# Patient Record
Sex: Male | Born: 1955 | Race: White | Hispanic: No | Marital: Married | Smoking: Never smoker
Health system: Southern US, Community
[De-identification: ages and names within clinical notes are randomized; demographics above are authoritative.]

## PROBLEM LIST (undated history)

## (undated) DIAGNOSIS — Z91018 Allergy to other foods: Secondary | ICD-10-CM

## (undated) DIAGNOSIS — N200 Calculus of kidney: Secondary | ICD-10-CM

## (undated) DIAGNOSIS — Z973 Presence of spectacles and contact lenses: Secondary | ICD-10-CM

## (undated) DIAGNOSIS — Z87442 Personal history of urinary calculi: Secondary | ICD-10-CM

## (undated) DIAGNOSIS — E785 Hyperlipidemia, unspecified: Secondary | ICD-10-CM

## (undated) DIAGNOSIS — J45909 Unspecified asthma, uncomplicated: Secondary | ICD-10-CM

## (undated) DIAGNOSIS — I10 Essential (primary) hypertension: Secondary | ICD-10-CM

## (undated) DIAGNOSIS — E669 Obesity, unspecified: Secondary | ICD-10-CM

## (undated) DIAGNOSIS — T7840XA Allergy, unspecified, initial encounter: Secondary | ICD-10-CM

## (undated) DIAGNOSIS — C801 Malignant (primary) neoplasm, unspecified: Secondary | ICD-10-CM

## (undated) HISTORY — DX: Allergy to other foods: Z91.018

## (undated) HISTORY — DX: Hyperlipidemia, unspecified: E78.5

## (undated) HISTORY — PX: PROSTATE BIOPSY: SHX241

## (undated) HISTORY — DX: Presence of spectacles and contact lenses: Z97.3

## (undated) HISTORY — PX: LITHOTRIPSY: SUR834

## (undated) HISTORY — DX: Obesity, unspecified: E66.9

## (undated) HISTORY — DX: Malignant (primary) neoplasm, unspecified: C80.1

## (undated) HISTORY — DX: Allergy, unspecified, initial encounter: T78.40XA

## (undated) HISTORY — DX: Unspecified asthma, uncomplicated: J45.909

---

## 1999-02-26 ENCOUNTER — Emergency Department (HOSPITAL_COMMUNITY): Admission: EM | Admit: 1999-02-26 | Discharge: 1999-02-27 | Payer: Self-pay | Admitting: Emergency Medicine

## 1999-02-26 ENCOUNTER — Encounter: Payer: Self-pay | Admitting: Emergency Medicine

## 1999-03-16 ENCOUNTER — Ambulatory Visit (HOSPITAL_COMMUNITY): Admission: RE | Admit: 1999-03-16 | Discharge: 1999-03-16 | Payer: Self-pay | Admitting: Orthopedic Surgery

## 1999-03-16 ENCOUNTER — Encounter: Payer: Self-pay | Admitting: Orthopedic Surgery

## 1999-04-27 ENCOUNTER — Encounter: Admission: RE | Admit: 1999-04-27 | Discharge: 1999-05-14 | Payer: Self-pay | Admitting: Orthopedic Surgery

## 2001-02-03 ENCOUNTER — Emergency Department (HOSPITAL_COMMUNITY): Admission: EM | Admit: 2001-02-03 | Discharge: 2001-02-03 | Payer: Self-pay | Admitting: Emergency Medicine

## 2004-03-18 ENCOUNTER — Emergency Department (HOSPITAL_COMMUNITY): Admission: EM | Admit: 2004-03-18 | Discharge: 2004-03-18 | Payer: Self-pay | Admitting: Emergency Medicine

## 2004-03-23 ENCOUNTER — Emergency Department (HOSPITAL_COMMUNITY): Admission: EM | Admit: 2004-03-23 | Discharge: 2004-03-23 | Payer: Self-pay | Admitting: Emergency Medicine

## 2006-02-15 ENCOUNTER — Emergency Department (HOSPITAL_COMMUNITY): Admission: EM | Admit: 2006-02-15 | Discharge: 2006-02-15 | Payer: Self-pay | Admitting: Emergency Medicine

## 2008-10-14 DIAGNOSIS — I10 Essential (primary) hypertension: Secondary | ICD-10-CM

## 2008-10-14 HISTORY — DX: Essential (primary) hypertension: I10

## 2009-01-02 ENCOUNTER — Emergency Department (HOSPITAL_COMMUNITY): Admission: EM | Admit: 2009-01-02 | Discharge: 2009-01-02 | Payer: Self-pay | Admitting: Emergency Medicine

## 2009-01-09 ENCOUNTER — Ambulatory Visit (HOSPITAL_COMMUNITY): Admission: RE | Admit: 2009-01-09 | Discharge: 2009-01-09 | Payer: Self-pay | Admitting: Urology

## 2010-05-04 IMAGING — CR DG ABDOMEN 1V
2 series · 2 of 2 positions shown · non-contrast
Comparison: CT abdomen pelvis 01/02/2009

CLINICAL DATA: Pre lithotripsy, right UPJ stone.

ABDOMEN - 1 VIEW

[t abdomen supine * (1 of 2)]
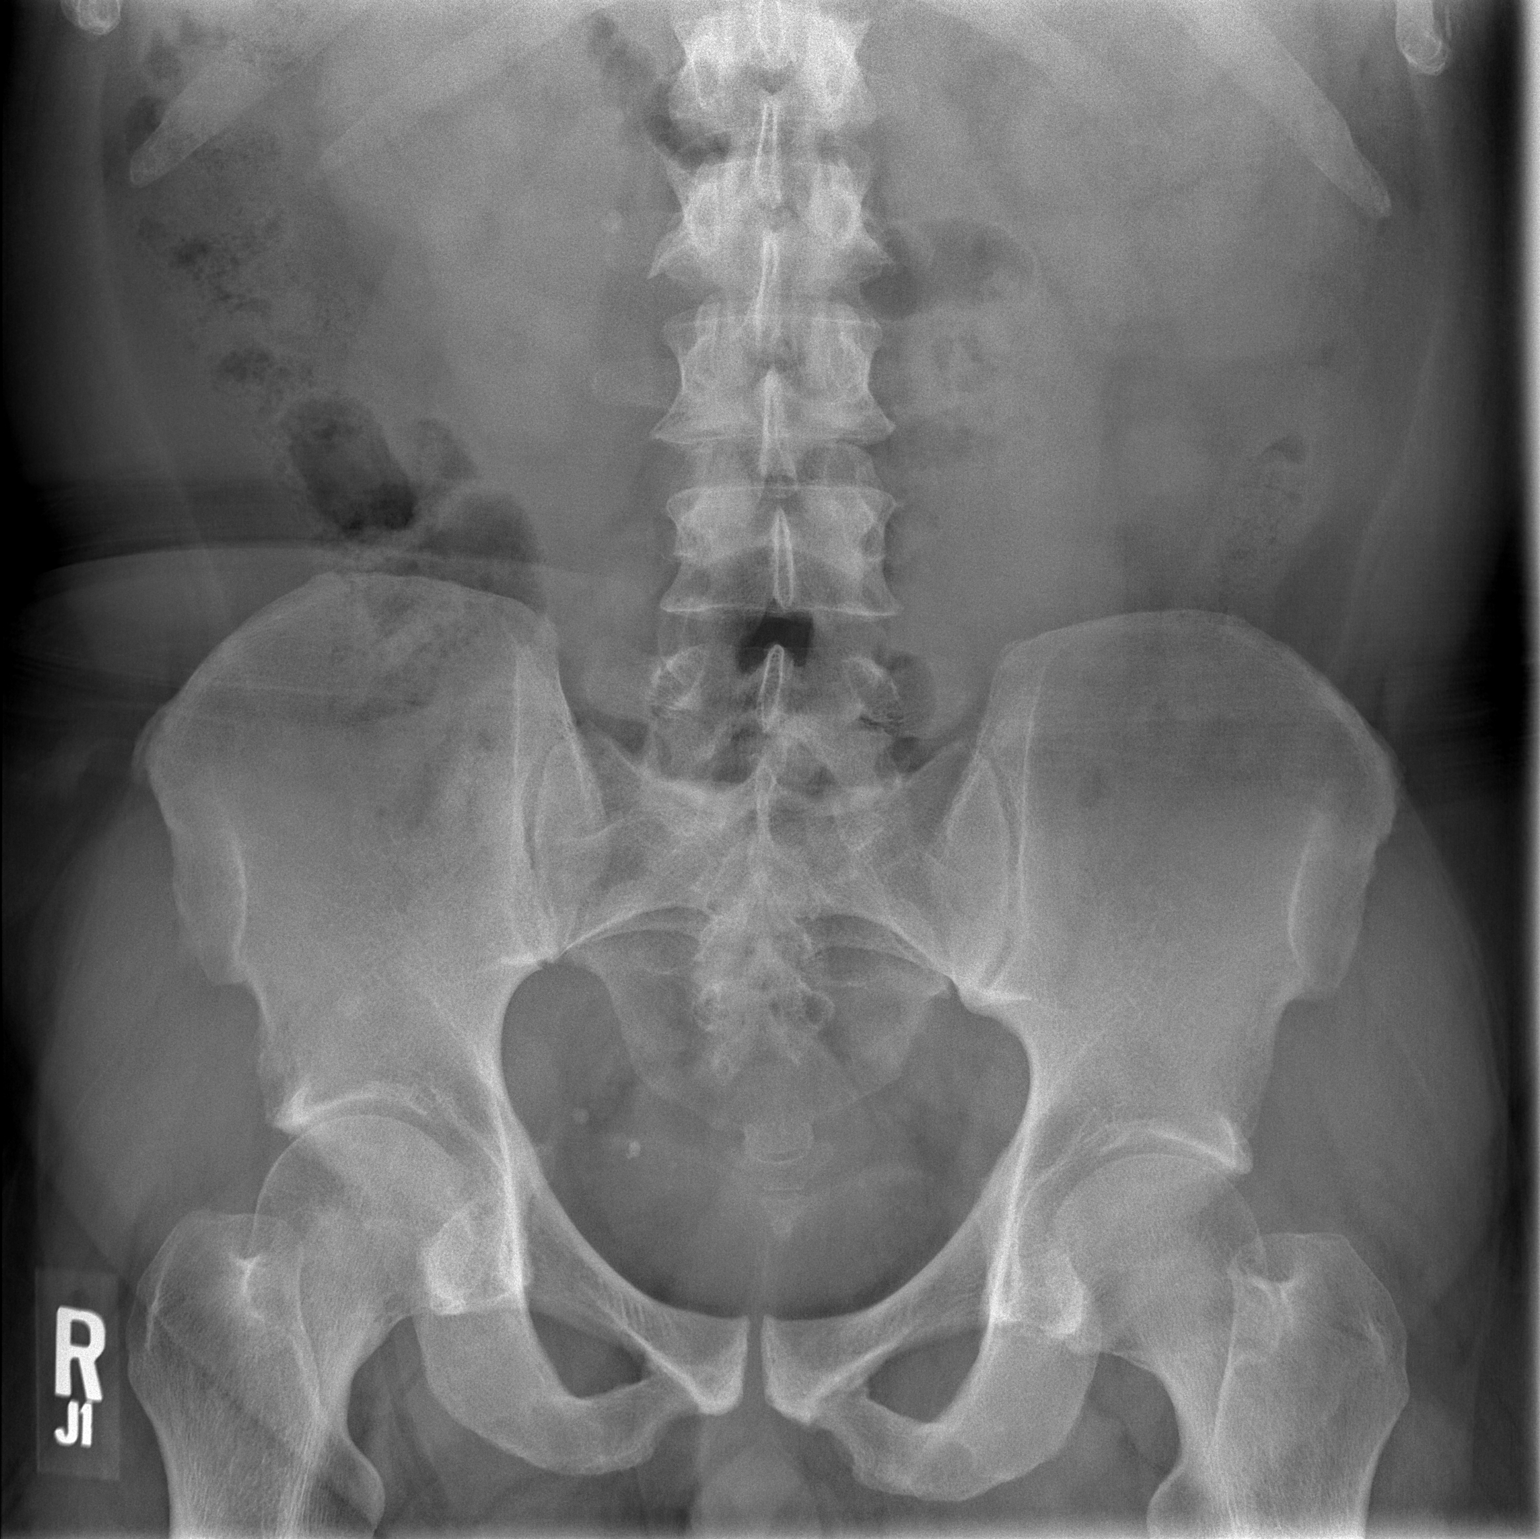

[t abdomen supine * (2 of 2)]
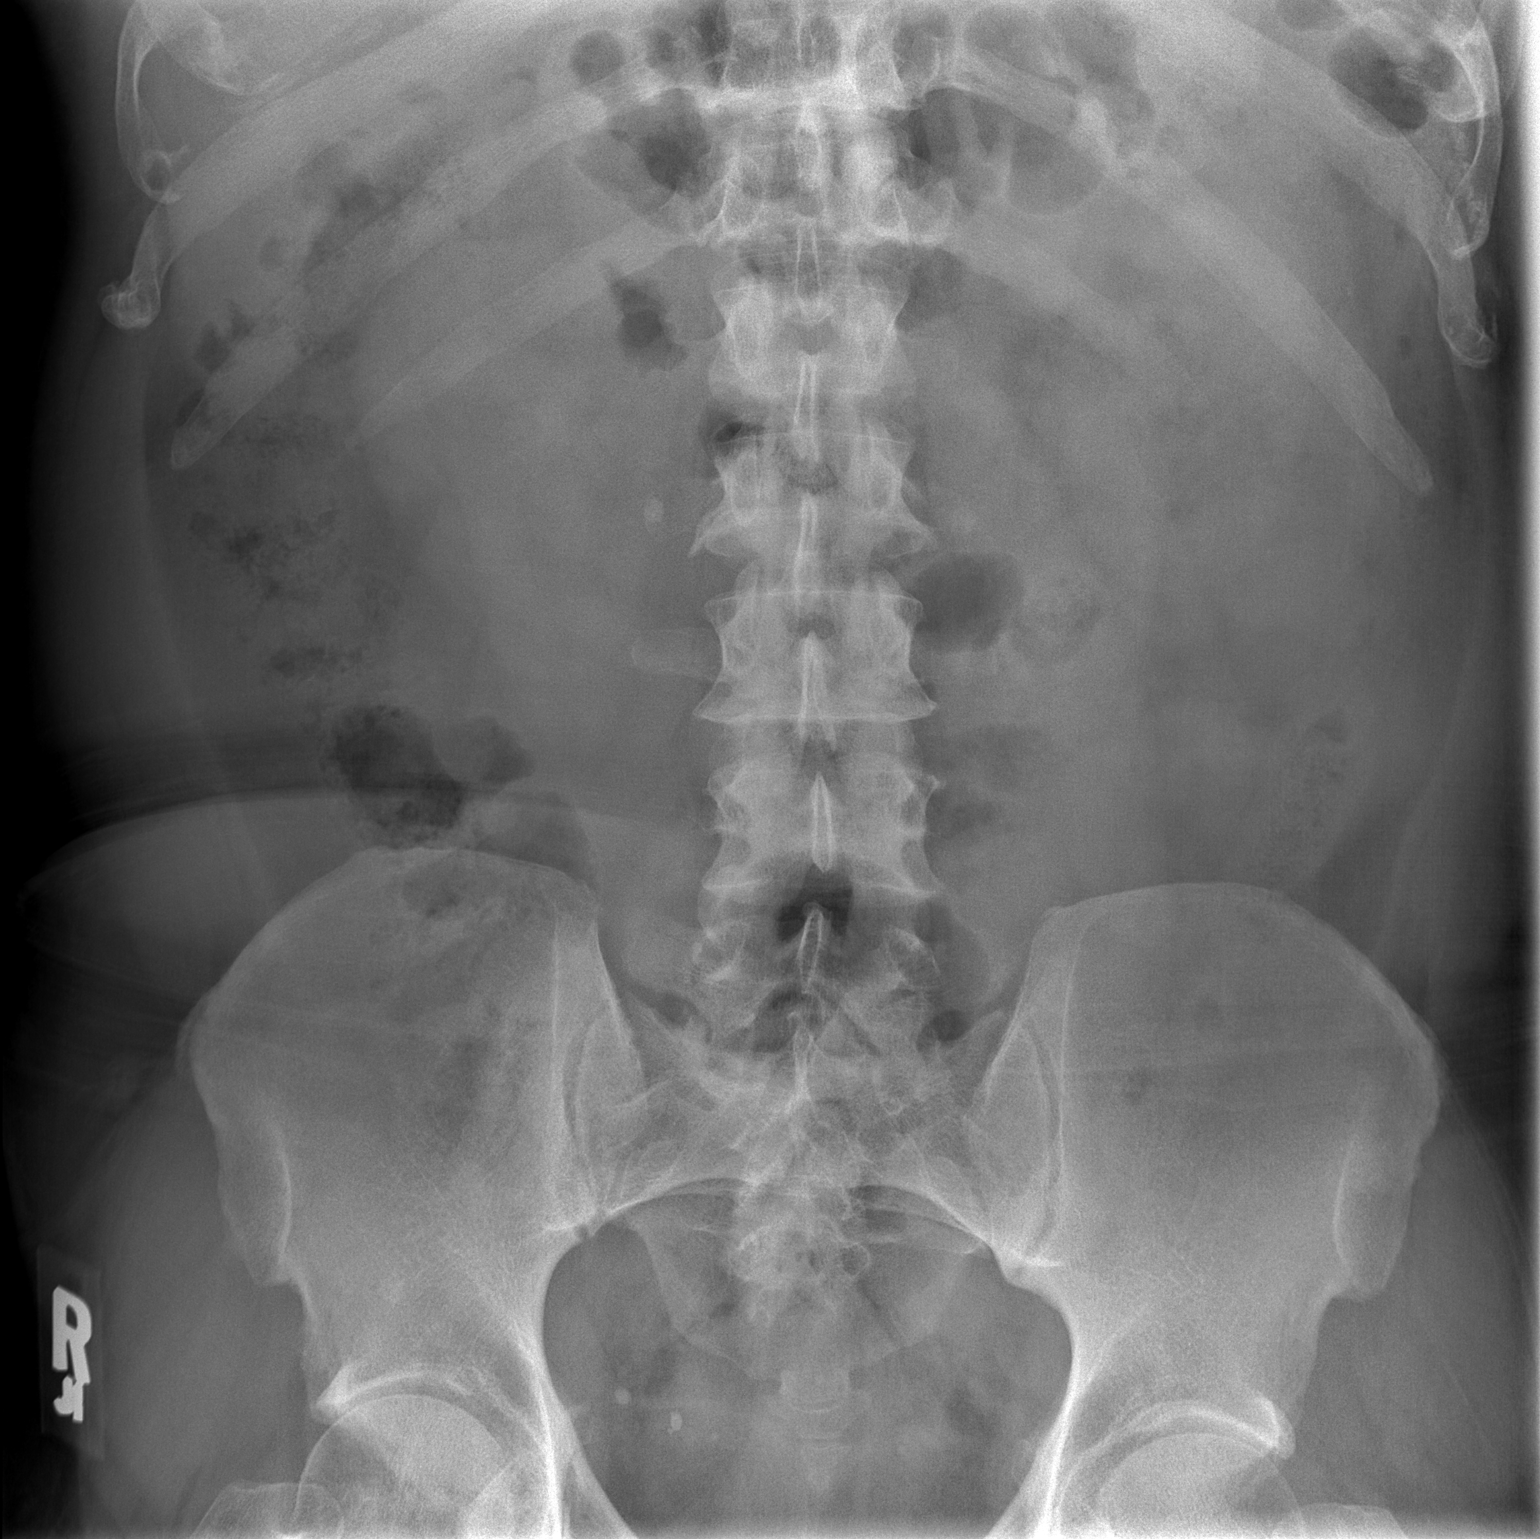

[2 of 2 positions shown; findings below may reference images not displayed]

FINDINGS: A 7 mm stone is seen in the expected location of the
right ureteral pelvic junction.  No additional radiopaque calculi
project over the renal outlines or expected course of the ureters
bilaterally.  Phleboliths are seen in the anatomic pelvis.
IMPRESSION: Right ureteral pelvic junction stone.

## 2011-01-24 LAB — CBC
HCT: 41.9 % (ref 39.0–52.0)
Hemoglobin: 14.1 g/dL (ref 13.0–17.0)
MCV: 94.1 fL (ref 78.0–100.0)
RBC: 4.45 MIL/uL (ref 4.22–5.81)
WBC: 13.3 10*3/uL — ABNORMAL HIGH (ref 4.0–10.5)

## 2011-01-24 LAB — DIFFERENTIAL
Eosinophils Absolute: 0 10*3/uL (ref 0.0–0.7)
Lymphs Abs: 1.1 10*3/uL (ref 0.7–4.0)
Monocytes Absolute: 0.5 10*3/uL (ref 0.1–1.0)
Monocytes Relative: 4 % (ref 3–12)
Neutrophils Relative %: 88 % — ABNORMAL HIGH (ref 43–77)

## 2011-01-24 LAB — BRAIN NATRIURETIC PEPTIDE: Pro B Natriuretic peptide (BNP): <30 pg/mL (ref 0.0–100.0)

## 2011-01-24 LAB — BASIC METABOLIC PANEL
Chloride: 108 mEq/L (ref 96–112)
Creatinine, Ser: 1.35 mg/dL (ref 0.4–1.5)
GFR calc Af Amer: >60 mL/min (ref >60)
Potassium: 4.6 mEq/L (ref 3.5–5.1)
Sodium: 139 mEq/L (ref 135–145)

## 2013-02-12 ENCOUNTER — Encounter (HOSPITAL_COMMUNITY): Payer: Self-pay | Admitting: Emergency Medicine

## 2013-02-12 ENCOUNTER — Emergency Department (HOSPITAL_COMMUNITY)
Admission: EM | Admit: 2013-02-12 | Discharge: 2013-02-12 | Disposition: A | Discharge status: Home / Self Care | Payer: 59 | Source: Home / Self Care

## 2013-02-12 DIAGNOSIS — I10 Essential (primary) hypertension: Secondary | ICD-10-CM

## 2013-02-12 HISTORY — DX: Calculus of kidney: N20.0

## 2013-02-12 HISTORY — DX: Essential (primary) hypertension: I10

## 2013-02-12 MED ORDER — LISINOPRIL-HYDROCHLOROTHIAZIDE 20-25 MG PO TABS: 1.0000 | ORAL_TABLET | Freq: Every day | ORAL | Status: DC

## 2013-02-12 NOTE — ED Notes (Signed)
Ran out of medicine a week ago, blood pressure medicine.  Has noticed "slight" headaches and realized he needed refill

## 2013-02-12 NOTE — ED Provider Notes (Signed)
History     CSN: [id number]  Arrival date & time 02/12/13  1125   None     Chief Complaint  Patient presents with  . Hypertension    (Consider location/radiation/quality/duration/timing/severity/associated sxs/prior treatment) Patient is a 57 y.o. male presenting with hypertension. The history is provided by the patient.  Hypertension This is a chronic problem. Episode onset: ran out of bp med approx 1 week ago, here for refill. The problem has not changed since onset.Pertinent negatives include no chest pain and no shortness of breath.    Past Medical History  Diagnosis Date  . Hypertension   . Kidney stone     Past Surgical History  Procedure Laterality Date  . Lithotripsy      No family history on file.  History  Substance Use Topics  . Smoking status: Never Smoker   . Smokeless tobacco: Not on file  . Alcohol Use: Yes      Review of Systems  Constitutional: Negative.   Respiratory: Negative for shortness of breath.   Cardiovascular: Negative for chest pain.    Allergies  Review of patient's allergies indicates no known allergies.  Home Medications   Current Outpatient Rx  Name  Route  Sig  Dispense  Refill  . lisinopril-hydrochlorothiazide (PRINZIDE,ZESTORETIC) 20-25 MG per tablet   Oral   Take 1 tablet by mouth daily.         Marland Kitchen lisinopril-hydrochlorothiazide (PRINZIDE,ZESTORETIC) 20-25 MG per tablet   Oral   Take 1 tablet by mouth daily.   30 tablet   1     BP 150/100  Pulse 88  Temp(Src) 97.3 F (36.3 C) (Oral)  Resp 18  SpO2 98%  Physical Exam  Nursing note and vitals reviewed. Constitutional: He is oriented to person, place, and time. He appears well-developed and well-nourished. No distress.  Cardiovascular: Regular rhythm and normal heart sounds.   Pulmonary/Chest: Effort normal and breath sounds normal.  Musculoskeletal: He exhibits no edema.  Neurological: He is alert and oriented to person, place, and time.  Skin: Skin is  warm and dry.  Psychiatric: He has a normal mood and affect.    ED Course  Procedures (including critical care time)  Labs Reviewed - No data to display No results found.   1. Hypertension, benign       MDM          Linna Hoff, MD 02/12/13 1239

## 2013-09-27 ENCOUNTER — Ambulatory Visit (INDEPENDENT_AMBULATORY_CARE_PROVIDER_SITE_OTHER): Payer: 59 | Admitting: Medical

## 2013-09-27 ENCOUNTER — Encounter: Payer: Self-pay | Admitting: Medical

## 2013-09-27 VITALS — BP 120/82 | HR 92 | Temp 98.2°F | Resp 18 | Ht 71.0 in | Wt 250.0 lb

## 2013-09-27 DIAGNOSIS — I10 Essential (primary) hypertension: Secondary | ICD-10-CM

## 2013-09-27 DIAGNOSIS — Z91018 Allergy to other foods: Secondary | ICD-10-CM

## 2013-09-27 MED ORDER — EPINEPHRINE 0.3 MG/0.3ML IJ SOAJ: 0.3000 mg | Freq: Once | INTRAMUSCULAR | Status: DC

## 2013-09-27 MED ORDER — LISINOPRIL-HYDROCHLOROTHIAZIDE 20-25 MG PO TABS: 1.0000 | ORAL_TABLET | Freq: Every day | ORAL | Status: DC

## 2013-09-27 NOTE — Progress Notes (Signed)
  Subjective:  Charles Moore is a 57 y.o. male who presents as a new patient today.   Was seeing [facility] prior.  Wants to establish primary care, needs refills on BP medication, ran out today.   Been on HTN medication x 4 years.  Good about taking his medication regular.  Tries to limit salt.  No particular c/o.  BP at home/work normal.  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    Hx/o asthma, no flare in years. Doesn't use inhaler.   No other c/o.   The following portions of the patient's history were reviewed and updated as appropriate: allergies, current medications, past family history, past medical history, past social history, past surgical history and problem list.  Past Medical History  Diagnosis Date  . Kidney stone   . Nut allergy   . Hypertension 2010  . Asthma     rare wheezing  . Wears glasses    Family History  Problem Relation Age of Onset  . Other Mother     pneumonia  . Cancer Father     lung  . Cancer Brother     lung  . Alcohol abuse Brother   . Heart disease Brother 20    CABG  . Hypertension Brother      ROS Otherwise as in subjective above  Objective: Physical Exam  BP 120/82  Pulse 92  Temp(Src) 98.2 F (36.8 C) (Oral)  Resp 18  Ht 5\' 11"  (1.803 m)  Wt 250 lb (113.399 kg)  BMI 34.88 kg/m2   General appearance: alert, no distress, WD/WN Neck: supple, no lymphadenopathy, no thyromegaly, no masses Heart: RRR, normal S1, S2, no murmurs Lungs: CTA bilaterally, no wheezes, rhonchi, or rales Pulses: 2+ radial pulses, 2+ pedal pulses, normal cap refill Ext: no edema   Assessment: Encounter Diagnoses  Name Primary?  . Essential hypertension, benign Yes  . Nut allergy      Plan: HTN - controlled, refilled medication, limit salt in diet, exercise regularly, eat healthy diet.   Nut allergy - discussed potential for exposure,  anaphylaxis.  Gave script for Epipen, discussed proper use, when to use, when to call 911. Follow up: within 3 mo for physical

## 2014-01-10 ENCOUNTER — Other Ambulatory Visit: Payer: Self-pay | Admitting: Family Medicine

## 2014-01-10 ENCOUNTER — Telehealth: Payer: Self-pay | Admitting: Medical

## 2014-01-10 MED ORDER — LISINOPRIL-HYDROCHLOROTHIAZIDE 20-25 MG PO TABS: 1.0000 | ORAL_TABLET | Freq: Every day | ORAL | Status: DC

## 2014-01-10 NOTE — Telephone Encounter (Signed)
Pt made an appt but needs refills on bo med. Please send to [place] outpt pharmacy.

## 2014-01-10 NOTE — Telephone Encounter (Signed)
Rx refills sent to the pharmacy. CLS

## 2014-01-21 ENCOUNTER — Encounter: Payer: 59 | Admitting: Medical

## 2014-01-31 ENCOUNTER — Encounter: Payer: Self-pay | Admitting: Medical

## 2014-01-31 ENCOUNTER — Telehealth: Payer: Self-pay | Admitting: Medical

## 2014-01-31 ENCOUNTER — Ambulatory Visit (INDEPENDENT_AMBULATORY_CARE_PROVIDER_SITE_OTHER): Payer: 59 | Admitting: Medical

## 2014-01-31 VITALS — BP 110/70 | HR 80 | Temp 98.2°F | Resp 16 | Wt 252.0 lb

## 2014-01-31 DIAGNOSIS — Z1211 Encounter for screening for malignant neoplasm of colon: Secondary | ICD-10-CM

## 2014-01-31 DIAGNOSIS — I10 Essential (primary) hypertension: Secondary | ICD-10-CM

## 2014-01-31 DIAGNOSIS — R202 Paresthesia of skin: Secondary | ICD-10-CM

## 2014-01-31 DIAGNOSIS — R209 Unspecified disturbances of skin sensation: Secondary | ICD-10-CM

## 2014-01-31 DIAGNOSIS — R5381 Other malaise: Secondary | ICD-10-CM

## 2014-01-31 DIAGNOSIS — R5383 Other fatigue: Secondary | ICD-10-CM

## 2014-01-31 LAB — CBC
HCT: 36.8 % — ABNORMAL LOW (ref 39.0–52.0)
HEMOGLOBIN: 12.1 g/dL — AB (ref 13.0–17.0)
MCH: 31.3 pg (ref 26.0–34.0)
MCHC: 32.9 g/dL (ref 30.0–36.0)
MCV: 95.3 fL (ref 78.0–100.0)
PLATELETS: 283 10*3/uL (ref 150–400)
RBC: 3.86 MIL/uL — AB (ref 4.22–5.81)
RDW: 13 % (ref 11.5–15.5)
WBC: 6.9 10*3/uL (ref 4.0–10.5)

## 2014-01-31 MED ORDER — LISINOPRIL-HYDROCHLOROTHIAZIDE 20-25 MG PO TABS: 1.0000 | ORAL_TABLET | Freq: Every day | ORAL | Status: DC

## 2014-01-31 NOTE — Telephone Encounter (Signed)
Referral is done and documented in EPIC and the patient is aware of the appointment. CLS

## 2014-01-31 NOTE — Telephone Encounter (Signed)
Refer for first colonoscopy

## 2014-01-31 NOTE — Progress Notes (Signed)
  Subjective:  Charles Moore is a 58 y.o. male who presents for recheck along with his wife.  HTN - compliant with medication without c/o.   Been on HTN medication x 4 years.  Good about taking his medication regular.  Tries to limit salt.  No particular c/o.  BP at home/work normal.  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  BP looking good of late.   Has new problem.  Been having numb tingling/reduced sensation in left hand for months.  Includes mostly fingers 2-5.  He uses hands a lot at work.   No injury.  He does sit with arms propped on table.  No pain in hands, no arm or neck pain.  No prior eval for same.  No treatment to date.   No family hx/o carpal tunnel syndrome.   Here for fasting labs.   Had been having some fatigued feeling, started taking iron OTC and this has helped.  No blood loss, no hx/o anemia.    No other c/o.   The following portions of the patient's history were reviewed and updated as appropriate: allergies, current medications, past family history, past medical history, past social history, past surgical history and problem list.  Past Medical History  Diagnosis Date  . Kidney stone   . Nut allergy   . Hypertension 2010  . Asthma     rare wheezing  . Wears glasses    Family History  Problem Relation Age of Onset  . Other Mother     pneumonia  . Cancer Father     lung  . Cancer Brother     lung  . Alcohol abuse Brother   . Heart disease Brother 68    CABG  . Hypertension Brother      ROS Otherwise as in subjective above  Objective: Physical Exam  BP 110/70  Pulse 80  Temp(Src) 98.2 F (36.8 C) (Oral)  Resp 16  Wt 252 lb (114.306 kg)   General appearance: alert, no distress, WD/WN, AA male Neck: supple, no lymphadenopathy, no thyromegaly, no masses Heart: RRR, normal S1, S2, no murmurs Lungs: CTA bilaterally, no wheezes, rhonchi, or rales Abdomen: +bs,  soft, nontneder, no mass, no organomegaly Pulses: 2+ radial pulses, 2+ pedal pulses, normal cap refill Ext: no edema Neuro: normal UE strength, sensation, DTRs, negative phalens and tinels.   Assessment: Encounter Diagnoses  Name Primary?  . Essential hypertension, benign Yes  . Paresthesia   . Special screening for malignant neoplasms, colon   . Other malaise and fatigue      Plan: HTN - controlled, refilled medication, limit salt in diet, exercise regularly, eat healthy diet.  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  Begin QHS wrist splints, Aleve OTC, recheck 3-4 wk.  Referral for first colonoscopy Fatigue - pending labs.

## 2014-02-01 LAB — LIPID PANEL
CHOL/HDL RATIO: 3.4 ratio
CHOLESTEROL: 131 mg/dL (ref 0–200)
HDL: 39 mg/dL — ABNORMAL LOW (ref >39)
LDL Cholesterol: 73 mg/dL (ref 0–99)
Triglycerides: 94 mg/dL (ref <150)
VLDL: 19 mg/dL (ref 0–40)

## 2014-02-01 LAB — COMPREHENSIVE METABOLIC PANEL
ALT: 17 U/L (ref 0–53)
AST: 17 U/L (ref 0–37)
Albumin: 4.2 g/dL (ref 3.5–5.2)
Alkaline Phosphatase: 52 U/L (ref 39–117)
BILIRUBIN TOTAL: 0.7 mg/dL (ref 0.2–1.2)
BUN: 11 mg/dL (ref 6–23)
CHLORIDE: 103 meq/L (ref 96–112)
CO2: 25 meq/L (ref 19–32)
Calcium: 9.2 mg/dL (ref 8.4–10.5)
Creat: 0.87 mg/dL (ref 0.50–1.35)
Glucose, Bld: 89 mg/dL (ref 70–99)
Potassium: 3.6 mEq/L (ref 3.5–5.3)
SODIUM: 140 meq/L (ref 135–145)
TOTAL PROTEIN: 7.1 g/dL (ref 6.0–8.3)

## 2014-02-01 LAB — TSH: TSH: 2.131 u[IU]/mL (ref 0.350–4.500)

## 2014-03-22 ENCOUNTER — Encounter: Payer: 59 | Admitting: Internal Medicine

## 2014-07-14 ENCOUNTER — Encounter: Payer: 59 | Admitting: Medical

## 2015-05-09 ENCOUNTER — Telehealth: Payer: Self-pay | Admitting: Medical

## 2015-05-09 ENCOUNTER — Other Ambulatory Visit: Payer: Self-pay | Admitting: Family Medicine

## 2015-05-09 MED ORDER — LISINOPRIL-HYDROCHLOROTHIAZIDE 20-25 MG PO TABS: 1.0000 | ORAL_TABLET | Freq: Every day | ORAL | Status: DC

## 2015-05-09 NOTE — Telephone Encounter (Signed)
Pt made med ck appt for 05/15/15. Requesting refill on Lisinopril 20-25mg  because he only has one pill left.

## 2015-05-09 NOTE — Telephone Encounter (Signed)
RX REFILL WAS SENT

## 2015-05-15 ENCOUNTER — Encounter: Payer: Self-pay | Admitting: Medical

## 2015-05-15 ENCOUNTER — Ambulatory Visit (INDEPENDENT_AMBULATORY_CARE_PROVIDER_SITE_OTHER): Payer: 59 | Admitting: Medical

## 2015-05-15 ENCOUNTER — Telehealth: Payer: Self-pay | Admitting: Medical

## 2015-05-15 VITALS — BP 132/82 | HR 64 | Wt 269.4 lb

## 2015-05-15 DIAGNOSIS — Z1211 Encounter for screening for malignant neoplasm of colon: Secondary | ICD-10-CM

## 2015-05-15 DIAGNOSIS — I10 Essential (primary) hypertension: Secondary | ICD-10-CM | POA: Diagnosis not present

## 2015-05-15 DIAGNOSIS — Z9119 Patient's noncompliance with other medical treatment and regimen: Secondary | ICD-10-CM | POA: Diagnosis not present

## 2015-05-15 DIAGNOSIS — Z91199 Patient's noncompliance with other medical treatment and regimen due to unspecified reason: Secondary | ICD-10-CM | POA: Insufficient documentation

## 2015-05-15 MED ORDER — LISINOPRIL-HYDROCHLOROTHIAZIDE 20-25 MG PO TABS: 1.0000 | ORAL_TABLET | Freq: Every day | ORAL | Status: DC

## 2015-05-15 MED ORDER — EPINEPHRINE 0.3 MG/0.3ML IJ SOAJ: 0.3000 mg | Freq: Once | INTRAMUSCULAR | Status: DC

## 2015-05-15 NOTE — Telephone Encounter (Signed)
Refer for screening colonoscopy 

## 2015-05-15 NOTE — Progress Notes (Signed)
    Subjective: Here for follow-up of hypertension.  Last visit almost 16 months ago.   He is still taking his medication and after running out, he was using his wife's medication that is the same brand.  Heis exercising with walking 3 days per week.  He is adherent to a low-salt diet.  Blood pressure is well controlled at home.   Cardiac symptoms: none. Patient denies: chest pain, dyspnea, exertional chest pressure/discomfort, fatigue, irregular heart beat and palpitations. Cardiovascular risk factors: advanced age (older than 71 for men, 74 for women), hypertension, male gender and obesity (BMI >= 30 kg/m2). Use of agents associated with hypertension: none. History of target organ damage: none.  No prior EKG or heart tests.   After last visit, he never was able to fit a colonoscopy in his schedule, so he is yet to have this.    No new c/o, in normal state of health.    Past Medical History  Diagnosis Date  . Kidney stone   . Nut allergy   . Hypertension 2010  . Asthma     rare wheezing  . Wears glasses    ROS as in subjective   Objective: BP 132/82 mmHg  Pulse 64  Wt 269 lb 6.4 oz (122.199 kg)  General appearance: alert, no distress, WD/WN, joyful appearing AA male Neck: supple, no lymphadenopathy, no thyromegaly, no masses Heart: RRR, normal S1, S2, no murmurs Lungs: CTA bilaterally, no wheezes, rhonchi, or rales Abdomen: +bs, soft, non tender, non distended, no masses, no hepatomegaly, no splenomegaly Pulses: 2+ symmetric, upper and lower extremities, normal cap refill No edema   Adult ECG Report  Indication: HTN  Rate: 92 bpm  Rhythm: normal sinus rhythm  QRS Axis: -41 degrees  PR Interval: 159ms  QRS Duration: 89ms  QTc: 436ms  Conduction Disturbances: left axis deviation  Other Abnormalities: borderline LVH, left atrial enlargement  Patient's cardiac risk factors are: advanced age (older than 56 for men, 30 for women), hypertension, male gender and obesity (BMI >=  30 kg/m2).  EKG comparison: none  Narrative Interpretation: left axis deviation, left atrial enlargement, otherwise unremarkable EKG   Assessment:  Encounter Diagnoses  Name Primary?  . Essential hypertension Yes  . Noncompliance   . Special screening for malignant neoplasms, colon     Plan: C/t same medication, c/t healthy diet, exercise, work on weight loss, limit salt, and return soon for fasting labs and physical.   Discussed the importance of compliance, not running out of medications  Referral again for first screening colonoscopy

## 2015-05-15 NOTE — Addendum Note (Signed)
Addended by: Carlena Hurl on: 05/15/2015 07:47 PM   Modules accepted: Orders

## 2015-05-16 NOTE — Telephone Encounter (Signed)
Orders are in EPIC to [facility] and they will contact the patient for his appointment

## 2015-08-07 ENCOUNTER — Telehealth: Payer: Self-pay | Admitting: Medical

## 2015-08-07 MED ORDER — LISINOPRIL-HYDROCHLOROTHIAZIDE 20-25 MG PO TABS: 1.0000 | ORAL_TABLET | Freq: Every day | ORAL | Status: DC

## 2015-08-07 NOTE — Telephone Encounter (Signed)
Sent in 30

## 2015-08-07 NOTE — Telephone Encounter (Signed)
Pt made CPE appt for 08/29/15. Requesting refill on Lisinopril 20-25mg  to last until 11/15 appt since he will run out of this med on Friday

## 2015-08-29 ENCOUNTER — Encounter: Payer: Self-pay | Admitting: Gastroenterology

## 2015-08-29 ENCOUNTER — Encounter: Payer: Self-pay | Admitting: Medical

## 2015-08-29 ENCOUNTER — Ambulatory Visit (INDEPENDENT_AMBULATORY_CARE_PROVIDER_SITE_OTHER): Payer: 59 | Admitting: Medical

## 2015-08-29 VITALS — BP 112/70 | HR 85 | Ht 72.0 in | Wt 254.0 lb

## 2015-08-29 DIAGNOSIS — E669 Obesity, unspecified: Secondary | ICD-10-CM | POA: Diagnosis not present

## 2015-08-29 DIAGNOSIS — Z125 Encounter for screening for malignant neoplasm of prostate: Secondary | ICD-10-CM | POA: Diagnosis not present

## 2015-08-29 DIAGNOSIS — L918 Other hypertrophic disorders of the skin: Secondary | ICD-10-CM | POA: Diagnosis not present

## 2015-08-29 DIAGNOSIS — Z1211 Encounter for screening for malignant neoplasm of colon: Secondary | ICD-10-CM | POA: Diagnosis not present

## 2015-08-29 DIAGNOSIS — I10 Essential (primary) hypertension: Secondary | ICD-10-CM | POA: Diagnosis not present

## 2015-08-29 DIAGNOSIS — Z0000 Encounter for general adult medical examination without abnormal findings: Secondary | ICD-10-CM | POA: Diagnosis not present

## 2015-08-29 DIAGNOSIS — R8290 Unspecified abnormal findings in urine: Secondary | ICD-10-CM

## 2015-08-29 DIAGNOSIS — Z9119 Patient's noncompliance with other medical treatment and regimen: Secondary | ICD-10-CM | POA: Diagnosis not present

## 2015-08-29 DIAGNOSIS — K036 Deposits [accretions] on teeth: Secondary | ICD-10-CM | POA: Diagnosis not present

## 2015-08-29 DIAGNOSIS — N400 Enlarged prostate without lower urinary tract symptoms: Secondary | ICD-10-CM | POA: Diagnosis not present

## 2015-08-29 DIAGNOSIS — Z91199 Patient's noncompliance with other medical treatment and regimen due to unspecified reason: Secondary | ICD-10-CM

## 2015-08-29 LAB — CBC
HCT: 41.7 % (ref 39.0–52.0)
HEMOGLOBIN: 13.7 g/dL (ref 13.0–17.0)
MCH: 30.9 pg (ref 26.0–34.0)
MCHC: 32.9 g/dL (ref 30.0–36.0)
MCV: 94.1 fL (ref 78.0–100.0)
MPV: 11.3 fL (ref 8.6–12.4)
PLATELETS: 248 10*3/uL (ref 150–400)
RBC: 4.43 MIL/uL (ref 4.22–5.81)
RDW: 12.3 % (ref 11.5–15.5)
WBC: 6.8 10*3/uL (ref 4.0–10.5)

## 2015-08-29 LAB — POCT URINALYSIS DIPSTICK
BILIRUBIN UA: 1
Glucose, UA: NEGATIVE
KETONES UA: NEGATIVE
LEUKOCYTES UA: NEGATIVE
NITRITE UA: NEGATIVE
PH UA: 6
Spec Grav, UA: >=1.03
Urobilinogen, UA: 0.2

## 2015-08-29 LAB — COMPREHENSIVE METABOLIC PANEL
ALT: 18 U/L (ref 9–46)
AST: 21 U/L (ref 10–35)
Albumin: 4.1 g/dL (ref 3.6–5.1)
Alkaline Phosphatase: 55 U/L (ref 40–115)
BUN: 13 mg/dL (ref 7–25)
CALCIUM: 9.5 mg/dL (ref 8.6–10.3)
CHLORIDE: 104 mmol/L (ref 98–110)
CO2: 25 mmol/L (ref 20–31)
Creat: 0.86 mg/dL (ref 0.70–1.33)
GLUCOSE: 89 mg/dL (ref 65–99)
POTASSIUM: 3.8 mmol/L (ref 3.5–5.3)
Sodium: 140 mmol/L (ref 135–146)
Total Bilirubin: 1 mg/dL (ref 0.2–1.2)
Total Protein: 6.8 g/dL (ref 6.1–8.1)

## 2015-08-29 LAB — POCT UA - MICROSCOPIC ONLY: RBC, urine, microscopic: POSITIVE

## 2015-08-29 LAB — LIPID PANEL
CHOL/HDL RATIO: 3.4 ratio (ref <=5.0)
Cholesterol: 146 mg/dL (ref 125–200)
HDL: 43 mg/dL (ref >=40)
LDL CALC: 91 mg/dL (ref <130)
Triglycerides: 60 mg/dL (ref <150)
VLDL: 12 mg/dL (ref <30)

## 2015-08-29 LAB — HEMOGLOBIN A1C
HEMOGLOBIN A1C: 4.9 % (ref <5.7)
MEAN PLASMA GLUCOSE: 94 mg/dL (ref <117)

## 2015-08-29 MED ORDER — LISINOPRIL-HYDROCHLOROTHIAZIDE 20-25 MG PO TABS: 1.0000 | ORAL_TABLET | Freq: Every day | ORAL | Status: DC

## 2015-08-29 NOTE — Progress Notes (Signed)
Subjective:   HPI  Charles Moore is a 59 y.o. male who presents for a complete physical.  Reviewed their medical, surgical, family, social, medication, and allergy history and updated chart as appropriate.  Past Medical History  Diagnosis Date  . Kidney stone   . Nut allergy   . Hypertension 2010  . Asthma     rare wheezing  . Wears glasses   . Obesity     Past Surgical History  Procedure Laterality Date  . Lithotripsy    . Colonoscopy      never as of 08/2015    Social History   Social History  . Marital Status: Married    Spouse Name: N/A  . Number of Children: N/A  . Years of Education: N/A   Occupational History  . Not on file.   Social History Main Topics  . Smoking status: Never Smoker   . Smokeless tobacco: Not on file  . Alcohol Use: Yes     Comment: 1 beer a month  . Drug Use: No  . Sexual Activity: Not on file   Other Topics Concern  . 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  Exercise - with walking.   He notes being a prior fast food junky but mostly eats wife's cooking now.    Family History  Problem Relation Age of Onset  . Other Mother     pneumonia  . Cancer Father     lung  . Cancer Brother     lung  . Alcohol abuse Brother   . Heart disease Brother 27    CABG  . Hypertension Brother      Current outpatient prescriptions:  .  lisinopril-hydrochlorothiazide (PRINZIDE,ZESTORETIC) 20-25 MG tablet, Take 1 tablet by mouth daily., Disp: 90 tablet, Rfl: 3 .  EPINEPHrine 0.3 mg/0.3 mL IJ SOAJ injection, Inject 0.3 mLs (0.3 mg total) into the muscle once. (Patient not taking: Reported on 08/29/2015), Disp: 1 Device, Rfl: 0  Allergies  Allergen Reactions  . Other Anaphylaxis    Almonds, cashews, [place] nuts    Review of Systems Constitutional: -fever, -chills, -sweats, -unexpected weight change, -decreased appetite, -fatigue Allergy: -sneezing, -itching,  -congestion Dermatology: -changing moles, --rash, -lumps ENT: -runny nose, -ear pain, -sore throat, -hoarseness, -sinus pain, -teeth pain, - ringing in ears, -hearing loss, -nosebleeds Cardiology: -chest pain, -palpitations, -swelling, -difficulty breathing when lying flat, -waking up short of breath Respiratory: -cough, -shortness of breath, -difficulty breathing with exercise or exertion, -wheezing, -coughing up blood Gastroenterology: -abdominal pain, -nausea, -vomiting, -diarrhea, -constipation, -blood in stool, -changes in bowel movement, -difficulty swallowing or eating Hematology: -bleeding, -bruising  Musculoskeletal: -joint aches, -muscle aches, -joint swelling, -back pain, -neck pain, -cramping, -changes in gait Ophthalmology: denies vision changes, eye redness, itching, discharge Urology: -burning with urination, -difficulty urinating, -blood in urine, -urinary frequency, -urgency, -incontinence Neurology: -headache, -weakness, -tingling, -numbness, -memory loss, -falls, -dizziness Psychology: -depressed mood, -agitation, -sleep problems     Objective:   Physical Exam  BP 112/70 mmHg  Pulse 85  Ht 6' (1.829 m)  Wt 254 lb (115.214 kg)  BMI 34.44 kg/m2  SpO2 98%  General appearance: alert, no distress, WD/WN, AA male Skin: left upper back with 2 single pedunculated lesions suggestive of skin tags, right upper back similarly with 2 single pedunculated skin tags, right side of back laterally and mid thoracic region with pedunculated 73mm diameter skin tag, other benign  appearing macules noted, no other worrisome lesions noted HEENT: normocephalic, conjunctiva/corneas normal, sclerae anicteric, PERRLA, EOMi, nares patent, no discharge or erythema, pharynx normal Oral cavity: MMM, tongue normal, teeth with moderate plaque Neck: supple, no lymphadenopathy, no thyromegaly, no masses, normal ROM, no bruits Chest: non tender, normal shape and expansion Heart: RRR, normal S1, S2, no  murmurs Lungs: CTA bilaterally, no wheezes, rhonchi, or rales Abdomen: +bs, soft, non tender, non distended, no masses, no hepatomegaly, no splenomegaly, no bruits Back: non tender, normal ROM, no scoliosis Musculoskeletal: upper extremities non tender, no obvious deformity, normal ROM throughout, lower extremities non tender, no obvious deformity, normal ROM throughout Extremities: no edema, no cyanosis, no clubbing Pulses: 2+ symmetric, upper and lower extremities, normal cap refill Neurological: alert, oriented x 3, CN2-12 intact, strength normal upper extremities and lower extremities, sensation normal throughout, DTRs 2+ throughout, no cerebellar signs, gait normal Psychiatric: normal affect, behavior normal, pleasant  GU: normal male external genitalia, uncircumcised, nontender, no masses, no hernia, no lymphadenopathy Rectal: prostate moderately enlarged without nodules, occult negative stool, anus normal appearing, normal tone   Assessment and Plan :     Encounter Diagnoses  Name Primary?  . Routine general medical examination at a health care facility Yes  . Essential hypertension   . Obesity   . Abnormal urinalysis   . Special screening for malignant neoplasms, colon   . Screening for prostate cancer   . Noncompliance   . BPH (benign prostatic hypertrophy)   . Dental plaque   . Skin tag     Physical exam - discussed healthy lifestyle, diet, exercise, preventative care, vaccinations, and addressed their concerns.   See your eye doctor yearly for routine vision care. See your dentist yearly for routine dental care including hygiene visits twice yearly. Refer again for first screening colonoscopy. Obesity - advised diet changes,efforts at weight loss Abnormal urinalysis - likely due to fasting state, mildly dehydrated, concentrated urine.   Urine micro shows nothing worrisome. PSA screening HTN - c/t same medication BPH - asymptomatic Skin tags - he can return for  excision at his convenience.   Discussed potential out of pocket expense given the initial benign appearance of the lesions Follow-up pending labs

## 2015-08-29 NOTE — Addendum Note (Signed)
Addended by: Billie Lade on: 08/29/2015 01:23 PM   Modules accepted: Miquel Dunn

## 2015-08-30 ENCOUNTER — Other Ambulatory Visit: Payer: Self-pay | Admitting: Medical

## 2015-08-30 DIAGNOSIS — R972 Elevated prostate specific antigen [PSA]: Secondary | ICD-10-CM

## 2015-08-30 LAB — PSA: PSA: 5.34 ng/mL — AB (ref <=4.00)

## 2015-09-18 ENCOUNTER — Encounter: Payer: Self-pay | Admitting: Medical

## 2015-09-18 ENCOUNTER — Ambulatory Visit (INDEPENDENT_AMBULATORY_CARE_PROVIDER_SITE_OTHER): Payer: 59 | Admitting: Medical

## 2015-09-18 VITALS — BP 122/80 | HR 114 | Wt 254.0 lb

## 2015-09-18 DIAGNOSIS — R000 Tachycardia, unspecified: Secondary | ICD-10-CM | POA: Diagnosis not present

## 2015-09-18 DIAGNOSIS — R8290 Unspecified abnormal findings in urine: Secondary | ICD-10-CM | POA: Diagnosis not present

## 2015-09-18 DIAGNOSIS — R972 Elevated prostate specific antigen [PSA]: Secondary | ICD-10-CM

## 2015-09-18 LAB — POCT URINALYSIS DIPSTICK
GLUCOSE UA: NEGATIVE
Ketones, UA: NEGATIVE
Leukocytes, UA: NEGATIVE
Nitrite, UA: NEGATIVE
PH UA: 6
RBC UA: NEGATIVE
UROBILINOGEN UA: NEGATIVE

## 2015-09-18 NOTE — Progress Notes (Signed)
Subjective: Chief Complaint  Patient presents with  . Follow-up    said for just blood draws and he is fasting   Here for f/u.  I saw him recently for physical.  He had abnormal findings in urine and abnormal PSA.   His prostate was enlarged on exam but he denies any BPH symptoms.   No family hx/o prostate cancer, no personal hx/o prostatitis.   He has no new c/o.  He attributes his pulse to having a busy day and he rushed over here from work.  ROS as in subjective  Objective: BP 122/80 mmHg  Pulse 114  Wt 254 lb (115.214 kg)  Gen: wd, wn, nad otherwise not examined  Assessment:  Encounter Diagnoses  Name Primary?  . Abnormal PSA Yes  . Abnormal urinalysis   . Elevated PSA   . Tachycardia     Plan: discussed causes of elevated PSA.   Additional testing today.  F/u pending labs Abnormal UA last visit.  unfortunately he is here in the afternoon, so not necessarily the best time to check urine .  Clean catch urine today, but will likely need to repeat at a later date in the morning with fresh sample Tachycardia likely transient today

## 2015-09-19 LAB — PSA, TOTAL AND FREE
PSA, Free Pct: 26 % (ref >25)
PSA, Free: 1.19 ng/mL
PSA: 4.66 ng/mL — ABNORMAL HIGH (ref <=4.00)

## 2015-10-20 ENCOUNTER — Emergency Department (HOSPITAL_COMMUNITY)
Admission: EM | Admit: 2015-10-20 | Discharge: 2015-10-20 | Disposition: A | Discharge status: Home / Self Care | Payer: 59 | Attending: Emergency Medicine | Admitting: Emergency Medicine

## 2015-10-20 ENCOUNTER — Emergency Department (HOSPITAL_COMMUNITY): Payer: 59

## 2015-10-20 ENCOUNTER — Encounter (HOSPITAL_COMMUNITY): Payer: Self-pay | Admitting: *Deleted

## 2015-10-20 DIAGNOSIS — R61 Generalized hyperhidrosis: Secondary | ICD-10-CM | POA: Insufficient documentation

## 2015-10-20 DIAGNOSIS — R05 Cough: Secondary | ICD-10-CM | POA: Diagnosis not present

## 2015-10-20 DIAGNOSIS — R55 Syncope and collapse: Secondary | ICD-10-CM | POA: Insufficient documentation

## 2015-10-20 DIAGNOSIS — I10 Essential (primary) hypertension: Secondary | ICD-10-CM | POA: Insufficient documentation

## 2015-10-20 DIAGNOSIS — J45909 Unspecified asthma, uncomplicated: Secondary | ICD-10-CM | POA: Insufficient documentation

## 2015-10-20 DIAGNOSIS — E669 Obesity, unspecified: Secondary | ICD-10-CM | POA: Diagnosis not present

## 2015-10-20 DIAGNOSIS — Z87442 Personal history of urinary calculi: Secondary | ICD-10-CM | POA: Diagnosis not present

## 2015-10-20 DIAGNOSIS — Z79899 Other long term (current) drug therapy: Secondary | ICD-10-CM | POA: Insufficient documentation

## 2015-10-20 DIAGNOSIS — J069 Acute upper respiratory infection, unspecified: Secondary | ICD-10-CM | POA: Insufficient documentation

## 2015-10-20 LAB — CBC
HEMATOCRIT: 41.4 % (ref 39.0–52.0)
Hemoglobin: 14.1 g/dL (ref 13.0–17.0)
MCH: 32 pg (ref 26.0–34.0)
MCHC: 34.1 g/dL (ref 30.0–36.0)
MCV: 93.9 fL (ref 78.0–100.0)
PLATELETS: 212 10*3/uL (ref 150–400)
RBC: 4.41 MIL/uL (ref 4.22–5.81)
RDW: 12 % (ref 11.5–15.5)
WBC: 8.1 10*3/uL (ref 4.0–10.5)

## 2015-10-20 LAB — URINALYSIS, ROUTINE W REFLEX MICROSCOPIC
Bilirubin Urine: NEGATIVE
GLUCOSE, UA: NEGATIVE mg/dL
Ketones, ur: NEGATIVE mg/dL
LEUKOCYTES UA: NEGATIVE
Nitrite: NEGATIVE
PH: 6.5 (ref 5.0–8.0)
Protein, ur: 100 mg/dL — AB
SPECIFIC GRAVITY, URINE: 1.015 (ref 1.005–1.030)

## 2015-10-20 LAB — BASIC METABOLIC PANEL
ANION GAP: 8 (ref 5–15)
BUN: 10 mg/dL (ref 6–20)
CHLORIDE: 100 mmol/L — AB (ref 101–111)
CO2: 28 mmol/L (ref 22–32)
CREATININE: 1.2 mg/dL (ref 0.61–1.24)
Calcium: 9.1 mg/dL (ref 8.9–10.3)
GFR calc non Af Amer: >60 mL/min (ref >60)
Glucose, Bld: 125 mg/dL — ABNORMAL HIGH (ref 65–99)
POTASSIUM: 3.6 mmol/L (ref 3.5–5.1)
Sodium: 136 mmol/L (ref 135–145)

## 2015-10-20 LAB — CBG MONITORING, ED: Glucose-Capillary: 158 mg/dL — ABNORMAL HIGH (ref 65–99)

## 2015-10-20 LAB — BRAIN NATRIURETIC PEPTIDE: B NATRIURETIC PEPTIDE 5: 14.8 pg/mL (ref 0.0–100.0)

## 2015-10-20 LAB — URINE MICROSCOPIC-ADD ON

## 2015-10-20 LAB — I-STAT TROPONIN, ED: Troponin i, poc: 0 ng/mL (ref 0.00–0.08)

## 2015-10-20 MED ORDER — AZITHROMYCIN 250 MG PO TABS
500.0000 mg | ORAL_TABLET | Freq: Once | ORAL | Status: AC
  Administered 2015-10-20: 500 mg via ORAL
  Filled 2015-10-20: qty 2

## 2015-10-20 MED ORDER — SODIUM CHLORIDE 0.9 % IV BOLUS (SEPSIS)
1000.0000 mL | Freq: Once | INTRAVENOUS | Status: AC
  Administered 2015-10-20: 1000 mL via INTRAVENOUS

## 2015-10-20 MED ORDER — AZITHROMYCIN 250 MG PO TABS: 250.0000 mg | ORAL_TABLET | Freq: Every day | ORAL | Status: DC

## 2015-10-20 MED FILL — AZITHROMYCIN 250 MG TABLET: 250 | 5 days supply | Qty: 6 | Fill #0

## 2015-10-20 NOTE — Discharge Instructions (Signed)
Upper Respiratory Infection, Adult Most upper respiratory infections (URIs) are a viral infection of the air passages leading to the lungs. A URI affects the nose, throat, and upper air passages. The most common type of URI is nasopharyngitis and is typically referred to as "the common cold." URIs run their course and usually go away on their own. Most of the time, a URI does not require medical attention, but sometimes a bacterial infection in the upper airways can follow a viral infection. This is called a secondary infection. Sinus and middle ear infections are common types of secondary upper respiratory infections. Bacterial pneumonia can also complicate a URI. A URI can worsen asthma and chronic obstructive pulmonary disease (COPD). Sometimes, these complications can require emergency medical care and may be life threatening.  CAUSES Almost all URIs are caused by viruses. A virus is a type of germ and can spread from one person to another.  RISKS FACTORS You may be at risk for a URI if:   You smoke.   You have chronic heart or lung disease.  You have a weakened defense (immune) system.   You are very young or very old.   You have nasal allergies or asthma.  You work in crowded or poorly ventilated areas.  You work in health care facilities or schools. SIGNS AND SYMPTOMS  Symptoms typically develop 2-3 days after you come in contact with a cold virus. Most viral URIs last 7-10 days. However, viral URIs from the influenza virus (flu virus) can last 14-18 days and are typically more severe. Symptoms may include:   Runny or stuffy (congested) nose.   Sneezing.   Cough.   Sore throat.   Headache.   Fatigue.   Fever.   Loss of appetite.   Pain in your forehead, behind your eyes, and over your cheekbones (sinus pain).  Muscle aches.  DIAGNOSIS  Your health care provider may diagnose a URI by:  Physical exam.  Tests to check that your symptoms are not due to  another condition such as:  Strep throat.  Sinusitis.  Pneumonia.  Asthma. TREATMENT  A URI goes away on its own with time. It cannot be cured with medicines, but medicines may be prescribed or recommended to relieve symptoms. Medicines may help:  Reduce your fever.  Reduce your cough.  Relieve nasal congestion. HOME CARE INSTRUCTIONS   Take medicines only as directed by your health care provider.   Gargle warm saltwater or take cough drops to comfort your throat as directed by your health care provider.  Use a warm mist humidifier or inhale steam from a shower to increase air moisture. This may make it easier to breathe.  Drink enough fluid to keep your urine clear or pale yellow.   Eat soups and other clear broths and maintain good nutrition.   Rest as needed.   Return to work when your temperature has returned to normal or as your health care provider advises. You may need to stay home longer to avoid infecting others. You can also use a face mask and careful hand washing to prevent spread of the virus.  Increase the usage of your inhaler if you have asthma.   Do not use any tobacco products, including cigarettes, chewing tobacco, or electronic cigarettes. If you need help quitting, ask your health care provider. PREVENTION  The best way to protect yourself from getting a cold is to practice good hygiene.   Avoid oral or hand contact with people with cold   symptoms.   Wash your hands often if contact occurs.  There is no clear evidence that vitamin C, vitamin E, echinacea, or exercise reduces the chance of developing a cold. However, it is always recommended to get plenty of rest, exercise, and practice good nutrition.  SEEK MEDICAL CARE IF:   You are getting worse rather than better.   Your symptoms are not controlled by medicine.   You have chills.  You have worsening shortness of breath.  You have brown or red mucus.  You have yellow or brown nasal  discharge.  You have pain in your face, especially when you bend forward.  You have a fever.  You have swollen neck glands.  You have pain while swallowing.  You have white areas in the back of your throat. SEEK IMMEDIATE MEDICAL CARE IF:   You have severe or persistent:  Headache.  Ear pain.  Sinus pain.  Chest pain.  You have chronic lung disease and any of the following:  Wheezing.  Prolonged cough.  Coughing up blood.  A change in your usual mucus.  You have a stiff neck.  You have changes in your:  Vision.  Hearing.  Thinking.  Mood. MAKE SURE YOU:   Understand these instructions.  Will watch your condition.  Will get help right away if you are not doing well or get worse.   This information is not intended to replace advice given to you by your health care provider. Make sure you discuss any questions you have with your health care provider.   Document Released: 03/26/2001 Document Revised: 02/14/2015 Document Reviewed: 01/05/2014 Elsevier Interactive Patient Education 2016 [employer]  

## 2015-10-20 NOTE — ED Provider Notes (Signed)
CSN: [id number]     Arrival date & time 10/20/15  [id number] History   First MD Initiated Contact with Patient 10/20/15 0805     Chief Complaint  Patient presents with  . Cough  . Loss of Consciousness     (Consider location/radiation/quality/duration/timing/severity/associated sxs/prior Treatment) HPI  She does a past medical history kidney stone hypertension and asthma. Per patient and wife he has been having cough with congestion for the past 6 days. He reports having a syncopal episode on Monday while in the bathroom, passing out and waking up in the bathtub. He reports a chest wall soreness since then. He has been feeling increasingly more sick and came to the ER today because now he is feeling weak. In triage she had a blood draw done with associated syncopal episode. He described it as hearing voices going distant in his vision turning gray, per the wife he was out for a couple seconds. He denies any associated pain. In triage his pulse is 131 afebrile, oxygen 97% on room air with respirations at 20. On initial impression the patient is ill-appearing, diaphoretic and weak. He is alert and oriented to person place and time  Past Medical History  Diagnosis Date  . Kidney stone   . Nut allergy   . Hypertension 2010  . Asthma     rare wheezing  . Wears glasses   . Obesity    Past Surgical History  Procedure Laterality Date  . Lithotripsy    . Colonoscopy      never as of 08/2015   Family History  Problem Relation Age of Onset  . Other Mother     pneumonia  . Cancer Father     lung  . Cancer Brother     lung  . Alcohol abuse Brother   . Heart disease Brother 92    CABG  . Hypertension Brother    Social History  Substance Use Topics  . Smoking status: Never Smoker   . Smokeless tobacco: None  . Alcohol Use: Yes     Comment: 1 beer a month    Review of Systems  Review of Systems All other systems negative except as documented in the HPI. All pertinent positives and  negatives as reviewed in the HPI.  Allergies  Other  Home Medications   Prior to Admission medications   Medication Sig Start Date End Date Taking? Authorizing Provider  EPINEPHrine 0.3 mg/0.3 mL IJ SOAJ injection Inject 0.3 mLs (0.3 mg total) into the muscle once. 05/15/15  Yes Camelia Eng Tysinger, PA-C  lisinopril-hydrochlorothiazide (PRINZIDE,ZESTORETIC) 20-25 MG tablet Take 1 tablet by mouth daily. 08/29/15  Yes Camelia Eng Tysinger, PA-C  azithromycin (ZITHROMAX) 250 MG tablet Take 1 tablet (250 mg total) by mouth daily. Take first 2 tablets together, then 1 every day until finished. 10/20/15   Maleta Pacha Carlota Raspberry, PA-C   BP 108/66 mmHg  Pulse 96  Temp(Src) 98.1 F (36.7 C) (Oral)  Resp 16  SpO2 99% Physical Exam  Constitutional: He appears well-developed and well-nourished. He appears ill. He appears distressed.  HENT:  Head: Normocephalic and atraumatic.  Eyes: Conjunctivae and EOM are normal. Pupils are equal, round, and reactive to light. No scleral icterus.  Neck: Normal range of motion. Neck supple.  Cardiovascular: Normal rate, regular rhythm, normal heart sounds and intact distal pulses.   Pulmonary/Chest: Effort normal. No respiratory distress. He has no wheezes. He has no rales. He exhibits no tenderness.  Abdominal: Soft. There is no tenderness.  Musculoskeletal: He exhibits no edema or tenderness.  No LE swelling  Neurological: He is alert. He displays a negative Romberg sign.  No facial paralysis or slurring of speech, patient is alert and oriented to self. Cranial nerves WNL.   Skin: Skin is warm. No petechiae, no purpura and no rash noted. He is diaphoretic.  Psychiatric: His speech is not slurred.  Nursing note and vitals reviewed.   ED Course  Procedures (including critical care time) Labs Review Labs Reviewed  BASIC METABOLIC PANEL - Abnormal; Notable for the following:    Chloride 100 (*)    Glucose, Bld 125 (*)    All other components within normal limits  CBG  MONITORING, ED - Abnormal; Notable for the following:    Glucose-Capillary 158 (*)    All other components within normal limits  CBC  BRAIN NATRIURETIC PEPTIDE  URINALYSIS, ROUTINE W REFLEX MICROSCOPIC (NOT AT [facility])  Randolm Idol, ED    Imaging Review Dg Chest Port 1 View  10/20/2015  CLINICAL DATA:  Cough and sweating. EXAM: PORTABLE CHEST 1 VIEW COMPARISON:  None. FINDINGS: The heart size and mediastinal contours are within normal limits. Both lungs are clear. The visualized skeletal structures are unremarkable. IMPRESSION: Normal chest. Electronically Signed   By: Lorriane Shire M.D.   On: 10/20/2015 09:00   I have personally reviewed and evaluated these images and lab results as part of my medical decision-making.   EKG Interpretation   Date/Time:  Friday October 20 2015 07:54:49 EST Ventricular Rate:  128 PR Interval:  140 QRS Duration: 76 QT Interval:  302 QTC Calculation: 440 R Axis:   -42 Text Interpretation:  Sinus tachycardia with occasional Premature  ventricular complexes Left axis deviation Abnormal ECG Confirmed by ZAVITZ   MD, JOSHUA ([id number]) on 10/20/2015 8:18:35 AM      MDM   Final diagnoses:  URI (upper respiratory infection)    Chest xray, Troponin, CBG, CBC, BNP and BMP are reassuring. Pt does not have risk factors for PE and has HPI consistent with upper airway illness. Dr. Reather Converse saw the patient as well and feels that he would benefit from a Z-pack and that he is safe for home.  Influenza vs atypical penumonia.  Filed Vitals:   10/20/15 0900 10/20/15 0915  BP: 113/66 108/66  Pulse: 93 96  Temp:    Resp: 14 [id number], PA-C 10/20/15 [id number]  Elnora Morrison, MD 10/20/15 1550

## 2015-10-20 NOTE — ED Notes (Signed)
Pt reports being sick since 1/1, having cough and chest congestion. Had syncopal episode on Monday and unable to work this week. Denies n/v/d. HR 130 at triage.

## 2015-10-20 NOTE — ED Notes (Signed)
Pt cbg 158

## 2015-10-20 NOTE — ED Notes (Signed)
Pt had + syncopal episode at triage following blood draw.

## 2015-10-26 ENCOUNTER — Ambulatory Visit (AMBULATORY_SURGERY_CENTER): Payer: Self-pay | Admitting: *Deleted

## 2015-10-26 VITALS — Ht 69.0 in | Wt 249.0 lb

## 2015-10-26 DIAGNOSIS — Z1211 Encounter for screening for malignant neoplasm of colon: Secondary | ICD-10-CM

## 2015-10-26 NOTE — Progress Notes (Signed)
No egg or soy allergy known to patient  No issues with past sedation with any surgeries  or procedures, no intubation No diet pills per patient  No home 02 use per patient   emmi video       [email or web address]

## 2015-10-31 ENCOUNTER — Telehealth: Payer: Self-pay

## 2015-10-31 NOTE — Telephone Encounter (Signed)
error 

## 2015-11-10 ENCOUNTER — Encounter: Payer: 59 | Admitting: Gastroenterology

## 2015-11-15 HISTORY — PX: COLONOSCOPY: SHX174

## 2015-12-01 ENCOUNTER — Ambulatory Visit (AMBULATORY_SURGERY_CENTER): Payer: 59 | Admitting: Gastroenterology

## 2015-12-01 ENCOUNTER — Encounter: Payer: Self-pay | Admitting: Gastroenterology

## 2015-12-01 VITALS — BP 134/77 | HR 66 | Temp 98.8°F | Resp 15 | Ht 72.0 in | Wt 254.0 lb

## 2015-12-01 DIAGNOSIS — J45909 Unspecified asthma, uncomplicated: Secondary | ICD-10-CM | POA: Diagnosis not present

## 2015-12-01 DIAGNOSIS — I10 Essential (primary) hypertension: Secondary | ICD-10-CM | POA: Diagnosis not present

## 2015-12-01 DIAGNOSIS — K635 Polyp of colon: Secondary | ICD-10-CM | POA: Diagnosis not present

## 2015-12-01 DIAGNOSIS — Z1211 Encounter for screening for malignant neoplasm of colon: Secondary | ICD-10-CM | POA: Diagnosis present

## 2015-12-01 DIAGNOSIS — D122 Benign neoplasm of ascending colon: Secondary | ICD-10-CM | POA: Diagnosis not present

## 2015-12-01 DIAGNOSIS — E669 Obesity, unspecified: Secondary | ICD-10-CM | POA: Diagnosis not present

## 2015-12-01 DIAGNOSIS — D123 Benign neoplasm of transverse colon: Secondary | ICD-10-CM | POA: Diagnosis not present

## 2015-12-01 MED ORDER — SODIUM CHLORIDE 0.9 % IV SOLN: 500.0000 mL | INTRAVENOUS | Status: DC

## 2015-12-01 NOTE — Progress Notes (Signed)
Stable to RR 

## 2015-12-01 NOTE — Op Note (Signed)
[facility]  [address]. [place], [address]   COLONOSCOPY PROCEDURE REPORT  PATIENT: Charles Moore, Charles Moore  MR#: [id number] BIRTHDATE: 02-07-1956 , 13  yrs. old GENDER: male ENDOSCOPIST: Yetta Flock, MD REFERRED BY: Chana Bode PA PROCEDURE DATE:  12/01/2015 PROCEDURE:   Colonoscopy, screening, Colonoscopy with snare polypectomy, and Colonoscopy with biopsy First Screening Colonoscopy - Avg.  risk and is 50 yrs.  old or older Yes.  Prior Negative Screening - Now for repeat screening. N/A  History of Adenoma - Now for follow-up colonoscopy & has been > or = to 3 yrs.  N/A  Polyps Removed Today ASA CLASS:   Class II INDICATIONS:Screening for colonic neoplasia and Colorectal Neoplasm Risk Assessment for this procedure is average risk. MEDICATIONS: Propofol 300 mg IV  DESCRIPTION OF PROCEDURE:   After the risks benefits and alternatives of the procedure were thoroughly explained, informed consent was obtained.  The digital rectal exam revealed no abnormalities of the rectum.   The LB [id number]  endoscope was introduced through the anus and advanced to the cecum, which was identified by both the appendix and ileocecal valve. No adverse events experienced.   The quality of the prep was adequate  The instrument was then slowly withdrawn as the colon was fully examined. Estimated blood loss is zero unless otherwise noted in this procedure report.    COLON FINDINGS: A 23mm pedunculated polyp was noted in the transverse colon and removed with hot snare.  A diminutive sessile polyp was noted in the transverse colon and removed with cold forceps.  Two 40mm sessile polyps were noted in the ascending colon and removed with cold forceps.  The remainder of the colon was normal. Retroflexed views revealed internal hemorrhoids. The time to cecum = 4.1 Withdrawal time = 14.7   The scope was withdrawn and the procedure completed. COMPLICATIONS: There were no  immediate complications.  ENDOSCOPIC IMPRESSION: 4 polyps removed as outlined above Normal colon otherwise  RECOMMENDATIONS: Await pathology results Resume diet Resume medications No NSAIDS for 2 weeks, use only tylenol for pain  eSigned:  Yetta Flock, MD 12/01/2015 3:20 PM   cc:  Chana Bode PA, the patient

## 2015-12-01 NOTE — Patient Instructions (Signed)

## 2015-12-01 NOTE — Progress Notes (Signed)
Called to room to assist during endoscopic procedure.  Patient ID and intended procedure confirmed with present staff. Received instructions for my participation in the procedure from the performing physician.  

## 2015-12-04 ENCOUNTER — Telehealth: Payer: Self-pay | Admitting: *Deleted

## 2015-12-04 NOTE — Telephone Encounter (Signed)
  Follow up Call-  Call back number 12/01/2015  Post procedure Call Back phone  # [phone]  Permission to leave phone message Yes     Patient questions:  Patient's mailbox has not been set up yet.  No message left.

## 2015-12-05 ENCOUNTER — Encounter: Payer: Self-pay | Admitting: Gastroenterology

## 2015-12-15 MED FILL — LISINOPRIL-HCTZ 20-25 MG TA: 20-25 | 90 days supply | Qty: 90 | Fill #1

## 2016-04-04 MED FILL — LISINOPRIL-HCTZ 20-25 MG TA: 20-25 | 90 days supply | Qty: 90 | Fill #2

## 2016-07-18 MED FILL — LISINOPRIL-HCTZ 20-25 MG TA: 20-25 | 90 days supply | Qty: 90 | Fill #3

## 2016-12-31 ENCOUNTER — Ambulatory Visit (INDEPENDENT_AMBULATORY_CARE_PROVIDER_SITE_OTHER): Payer: 59 | Admitting: Medical

## 2016-12-31 ENCOUNTER — Encounter: Payer: Self-pay | Admitting: Medical

## 2016-12-31 ENCOUNTER — Other Ambulatory Visit: Payer: Self-pay

## 2016-12-31 VITALS — BP 138/94 | HR 80 | Wt 264.8 lb

## 2016-12-31 DIAGNOSIS — I10 Essential (primary) hypertension: Secondary | ICD-10-CM | POA: Diagnosis not present

## 2016-12-31 DIAGNOSIS — G5602 Carpal tunnel syndrome, left upper limb: Secondary | ICD-10-CM

## 2016-12-31 DIAGNOSIS — Z91199 Patient's noncompliance with other medical treatment and regimen due to unspecified reason: Secondary | ICD-10-CM

## 2016-12-31 DIAGNOSIS — Z9119 Patient's noncompliance with other medical treatment and regimen: Secondary | ICD-10-CM | POA: Diagnosis not present

## 2016-12-31 DIAGNOSIS — Z87892 Personal history of anaphylaxis: Secondary | ICD-10-CM

## 2016-12-31 DIAGNOSIS — E669 Obesity, unspecified: Secondary | ICD-10-CM

## 2016-12-31 DIAGNOSIS — R202 Paresthesia of skin: Secondary | ICD-10-CM | POA: Diagnosis not present

## 2016-12-31 LAB — COMPREHENSIVE METABOLIC PANEL
ALBUMIN: 4.2 g/dL (ref 3.6–5.1)
ALT: 21 U/L (ref 9–46)
AST: 21 U/L (ref 10–35)
Alkaline Phosphatase: 60 U/L (ref 40–115)
BILIRUBIN TOTAL: 0.9 mg/dL (ref 0.2–1.2)
BUN: 9 mg/dL (ref 7–25)
CALCIUM: 9.7 mg/dL (ref 8.6–10.3)
CO2: 25 mmol/L (ref 20–31)
CREATININE: 1.08 mg/dL (ref 0.70–1.25)
Chloride: 105 mmol/L (ref 98–110)
GLUCOSE: 93 mg/dL (ref 65–99)
POTASSIUM: 3.9 mmol/L (ref 3.5–5.3)
SODIUM: 140 mmol/L (ref 135–146)
TOTAL PROTEIN: 7.2 g/dL (ref 6.1–8.1)

## 2016-12-31 LAB — CBC
HCT: 43.5 % (ref 38.5–50.0)
Hemoglobin: 14.5 g/dL (ref 13.2–17.1)
MCH: 31.9 pg (ref 27.0–33.0)
MCHC: 33.3 g/dL (ref 32.0–36.0)
MCV: 95.6 fL (ref 80.0–100.0)
MPV: 11.6 fL (ref 7.5–12.5)
PLATELETS: 232 10*3/uL (ref 140–400)
RBC: 4.55 MIL/uL (ref 4.20–5.80)
RDW: 12.2 % (ref 11.0–15.0)
WBC: 7.3 10*3/uL (ref 4.0–10.5)

## 2016-12-31 LAB — TSH: TSH: 3.4 mIU/L (ref 0.40–4.50)

## 2016-12-31 LAB — LIPID PANEL
CHOL/HDL RATIO: 3.5 ratio (ref <5.0)
CHOLESTEROL: 153 mg/dL (ref <200)
HDL: 44 mg/dL (ref >40)
LDL Cholesterol: 89 mg/dL (ref <100)
TRIGLYCERIDES: 101 mg/dL (ref <150)
VLDL: 20 mg/dL (ref <30)

## 2016-12-31 MED ORDER — LISINOPRIL-HYDROCHLOROTHIAZIDE 20-25 MG PO TABS: 1.0000 | ORAL_TABLET | Freq: Every day | ORAL | 3 refills | Status: DC

## 2016-12-31 MED ORDER — EPINEPHRINE 0.3 MG/0.3ML IJ SOAJ: 0.3000 mg | Freq: Once | INTRAMUSCULAR | 0 refills | Status: AC

## 2016-12-31 MED FILL — LISINOPRIL-HCTZ 20-25 MG TA: 20-25 | 90 days supply | Qty: 90 | Fill #0

## 2016-12-31 MED FILL — EPINEPHRINE 0.3 MG AUTO-INJ: 0.3 | 30 days supply | Qty: 2 | Fill #0

## 2016-12-31 NOTE — Progress Notes (Signed)
Subjective: Chief Complaint  Patient presents with  . med check    med check    Here for med check.  Last visit here 09/2015.  accompanied by wife today.  Here for f/u on hypertension, hx/o anaphylaxis.   Hypertension - Here for follow-up of hypertension.   He takes his medication on the weekdays, but due to less stress on weekends, he doesn't take his BP medication on weekends.  Not checking BPs.  Cardiac symptoms: none. Patient denies: chest pain, claudication, dyspnea, fatigue and lower extremity edema. Cardiovascular risk factors: advanced age (older than 103 for men, 34 for women), hypertension, male gender and obesity (BMI >= 30 kg/m2).    Walks a lot at work for exercise.  Wife cooks, so she is somewhat careful.  He does ok on diet, not real strict.    Asthma - only flares up with chest cold, otherwise no problems  He had cookies at a holiday party that apparently had nuts in them, so he had to use Epipen then, had mild reaction.   Has hx/o some carpal tunnel syndrome, still wearing reinforced wrist splint from a year ago.   Is not worse or better, but the same.  Gets swelling sometimes, weakness , numbness in left arm/wrist.  Has several year hx/o numbness on left side, torso, left arm, left leg.   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    Since last visit saw urology, had biopsies for elevated PSA.  Past Medical History:  Diagnosis Date  . 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 Asthma    rare wheezing  . Hypertension 2010  . Kidney stone   . Nut allergy   . Obesity   . Wears glasses    Current Outpatient Prescriptions on File Prior to Visit  Medication Sig Dispense Refill  . aspirin 81 MG chewable tablet Chew 81 mg by mouth daily.     No current facility-administered medications on file prior to visit.    ROS as in subjective   Objective: BP (!) 138/94   Pulse 80   Wt 264 lb 12.8 oz (120.1 kg)   SpO2 98%   BMI 35.91 kg/m   BP Readings from Last 3  Encounters:  12/31/16 (!) 138/94  12/01/15 134/77  10/20/15 119/72   Wt Readings from Last 3 Encounters:  12/31/16 264 lb 12.8 oz (120.1 kg)  12/01/15 254 lb (115.2 kg)  10/26/15 249 lb (112.9 kg)   General appearance: alert, no distress, WD/WN, AA male Skin: unremarkable Neck: supple, no lymphadenopathy, no thyromegaly, no masses, decreased neck extension, but rest of neck ROM is ok.   Heart: RRR, normal S1, S2, no murmurs Lungs: CTA bilaterally, no wheezes, rhonchi, or rales Abdomen: +bs, soft, non tender, non distended, no masses, no hepatomegaly, no splenomegaly Pulses: 2+ symmetric, upper and lower extremities, normal cap refill No edema Legs without edema Legs nontender, normal ROM, no deformity Neuro: no obvious decreased sensation, or decreased strength of left side, DTRs WNL, negative phalens and tinels.    Assessment: Encounter Diagnoses  Name Primary?  . Essential hypertension Yes  . Noncompliance   . Obesity without serious comorbidity, unspecified classification, unspecified obesity type   . History of anaphylaxis   . Paresthesia   . Carpal tunnel syndrome of left wrist      Plan: Discussed need to have better f/u and monitoring of BP.  Last visit over a year ago.  Discussed need to take  his BP medication every day.   Limit salt intake, work on [employer], exercise.  Obesity - work on losing weight through healthy diet and exercise  Hx/o anaphylaxis - discussed proper use of Epipen, benadryl OTC, and when to call 911 or f/u.  paresthesia - likely due to DDD C and L spine.   Labs today.  We are referring to ortho for the CTS left hand and left arm paresthesias  Will request urology records   Charles Moore was seen today for med check.  Diagnoses and all orders for this visit:  Essential hypertension -     CBC -     Lipid panel -     Comprehensive metabolic panel -     TSH -     Microalbumin / creatinine urine ratio -     Hemoglobin A1c -     Vitamin  B12  Noncompliance  Obesity without serious comorbidity, unspecified classification, unspecified obesity type  History of anaphylaxis  Paresthesia -     CBC -     Comprehensive metabolic panel -     TSH -     Vitamin B12  Carpal tunnel syndrome of left wrist -     Ambulatory referral to Orthopedic Surgery

## 2017-01-01 ENCOUNTER — Other Ambulatory Visit: Payer: Self-pay | Admitting: Medical

## 2017-01-01 LAB — HEMOGLOBIN A1C
Hgb A1c MFr Bld: 4.4 % (ref <5.7)
Mean Plasma Glucose: 80 mg/dL

## 2017-01-01 LAB — VITAMIN B12: VITAMIN B 12: 731 pg/mL (ref 200–1100)

## 2017-01-01 MED ORDER — ASPIRIN EC 81 MG PO TBEC: 81.0000 mg | DELAYED_RELEASE_TABLET | Freq: Every day | ORAL | 3 refills | Status: DC

## 2017-01-01 MED ORDER — PRAVASTATIN SODIUM 20 MG PO TABS: 20.0000 mg | ORAL_TABLET | Freq: Every day | ORAL | 0 refills | Status: DC

## 2017-01-01 MED FILL — PRAVASTATIN SODIUM 20 MG TA: 20 | 90 days supply | Qty: 90 | Fill #0

## 2017-01-01 MED FILL — ASPIR-LOW 81 MG TABLET EC: 81 | 90 days supply | Qty: 90 | Fill #0

## 2017-01-02 LAB — MICROALBUMIN / CREATININE URINE RATIO
Creatinine, Urine: 126 mg/dL (ref 20–370)
MICROALB/CREAT RATIO: 144 ug/mg{creat} — AB (ref <30)
Microalb, Ur: 18.1 mg/dL

## 2017-01-20 ENCOUNTER — Other Ambulatory Visit (INDEPENDENT_AMBULATORY_CARE_PROVIDER_SITE_OTHER): Payer: Self-pay

## 2017-01-20 ENCOUNTER — Ambulatory Visit (INDEPENDENT_AMBULATORY_CARE_PROVIDER_SITE_OTHER): Payer: Self-pay

## 2017-01-20 ENCOUNTER — Encounter (INDEPENDENT_AMBULATORY_CARE_PROVIDER_SITE_OTHER): Payer: Self-pay | Admitting: Orthopaedic Surgery

## 2017-01-20 ENCOUNTER — Ambulatory Visit (INDEPENDENT_AMBULATORY_CARE_PROVIDER_SITE_OTHER): Payer: 59 | Admitting: Orthopaedic Surgery

## 2017-01-20 VITALS — BP 157/102 | HR 112 | Resp 14 | Ht 72.0 in | Wt 264.0 lb

## 2017-01-20 DIAGNOSIS — M79641 Pain in right hand: Secondary | ICD-10-CM

## 2017-01-20 DIAGNOSIS — M25532 Pain in left wrist: Secondary | ICD-10-CM | POA: Diagnosis not present

## 2017-01-20 DIAGNOSIS — M79642 Pain in left hand: Principal | ICD-10-CM

## 2017-01-20 NOTE — Progress Notes (Signed)
Office Visit Note   Patient: Charles Moore           Date of Birth: [birth year]           MRN: [id number] Visit Date: 01/20/2017              Requested by: Carlena Hurl, PA-C [address] [place],  [address] PCP: Crisoforo Oxford, PA-C   Assessment & Plan: Visit Diagnoses:  1. Pain in left wrist   Probable left carpal tunnel syndrome  Plan: EMGs nerve conduction studies to both upper extremities  Orders:  Orders Placed This Encounter  Procedures  . XR Wrist Complete Left   No orders of the defined types were placed in this encounter.     Procedures: No procedures performed   Clinical Data: No additional findings.   Subjective: Chief Complaint  Patient presents with  . Left Wrist - Pain, Numbness, Edema, Weakness    Charles Moore is a 61 year old male that presents with left carpal tunnel syndrome x 1 year. He has not seeked any medical attention to address this issue. Lately he has noticed his fingers going numb, weakness, pain and dwelling. 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  Charles Moore is  right-handed yet having problems predominantly on the left. He's been experiencing numbness and tingling in the radial 3 digits of his left hand. He does wear a volar wrist splint that seems to help. His job at [employer] requires him to perform repetitive activity. He's not having much trouble when he drives a car or when he sleeps. He denies any history of injury or trauma.  Review of Systems   Objective: Vital Signs: BP (!) 157/102   Pulse (!) 112   Resp 14   Ht 6' (1.829 m)   Wt 264 lb (119.7 kg)   BMI 35.80 kg/m   Physical Exam  Ortho Exam left hand was warm and dry. Full grip and release. Normal opposition of thumb to little finger. Minimally positive Tinel's at the wrist negative. Negative Phalen's. No muscle atrophy. Skin intact. Good capillary refill  and pulses. Denies any pain with range of motion of left shoulder or elbow. Some loss of neck extension probably consistent with osteoarthritis but unable to reproduce any pain into his left upper extremity with motion of the cervical spine.  Specialty Comments:  No specialty comments available.  Imaging: No results found.   PMFS History: Patient Active Problem List   Diagnosis Date Noted  . History of anaphylaxis 12/31/2016  . Paresthesia 12/31/2016  . Carpal tunnel syndrome of left wrist 12/31/2016  . Obesity 08/29/2015  . Abnormal urinalysis 08/29/2015  . Special screening for malignant neoplasms, colon 08/29/2015  . Screening for prostate cancer 08/29/2015  . Routine general medical examination at a health care facility 08/29/2015  . BPH (benign prostatic hypertrophy) 08/29/2015  . Dental plaque 08/29/2015  . Skin tag 08/29/2015  . Essential hypertension 05/15/2015  . Noncompliance 05/15/2015   Past Medical History:  Diagnosis Date  . 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 Asthma    rare wheezing  . Hypertension 2010  . Kidney stone   . Nut allergy   . Obesity   . Wears glasses     Family History  Problem Relation Age of Onset  . Other Mother     pneumonia  .  Cancer Father     lung  . Cancer Brother     lung  . Alcohol abuse Brother   . Heart disease Brother 60    CABG  . Hypertension Brother   . Colon polyps Brother   . Colon cancer Paternal Grandfather   . Esophageal cancer Neg Hx   . Rectal cancer Neg Hx   . Stomach cancer Neg Hx     Past Surgical History:  Procedure Laterality Date  . LITHOTRIPSY     Social History   Occupational History  . Not on file.   Social History Main Topics  . Smoking status: Never Smoker  . Smokeless tobacco: Never Used  . Alcohol use 0.0 oz/week     Comment: 1 beer a month  . Drug use: No  . Sexual activity: Not on file

## 2017-02-11 IMAGING — CR DG CHEST 1V PORT
1 series · 1 of 1 positions shown · non-contrast
Comparison: None.

CLINICAL DATA: Cough and sweating.

EXAM:
PORTABLE CHEST 1 VIEW

[AP]
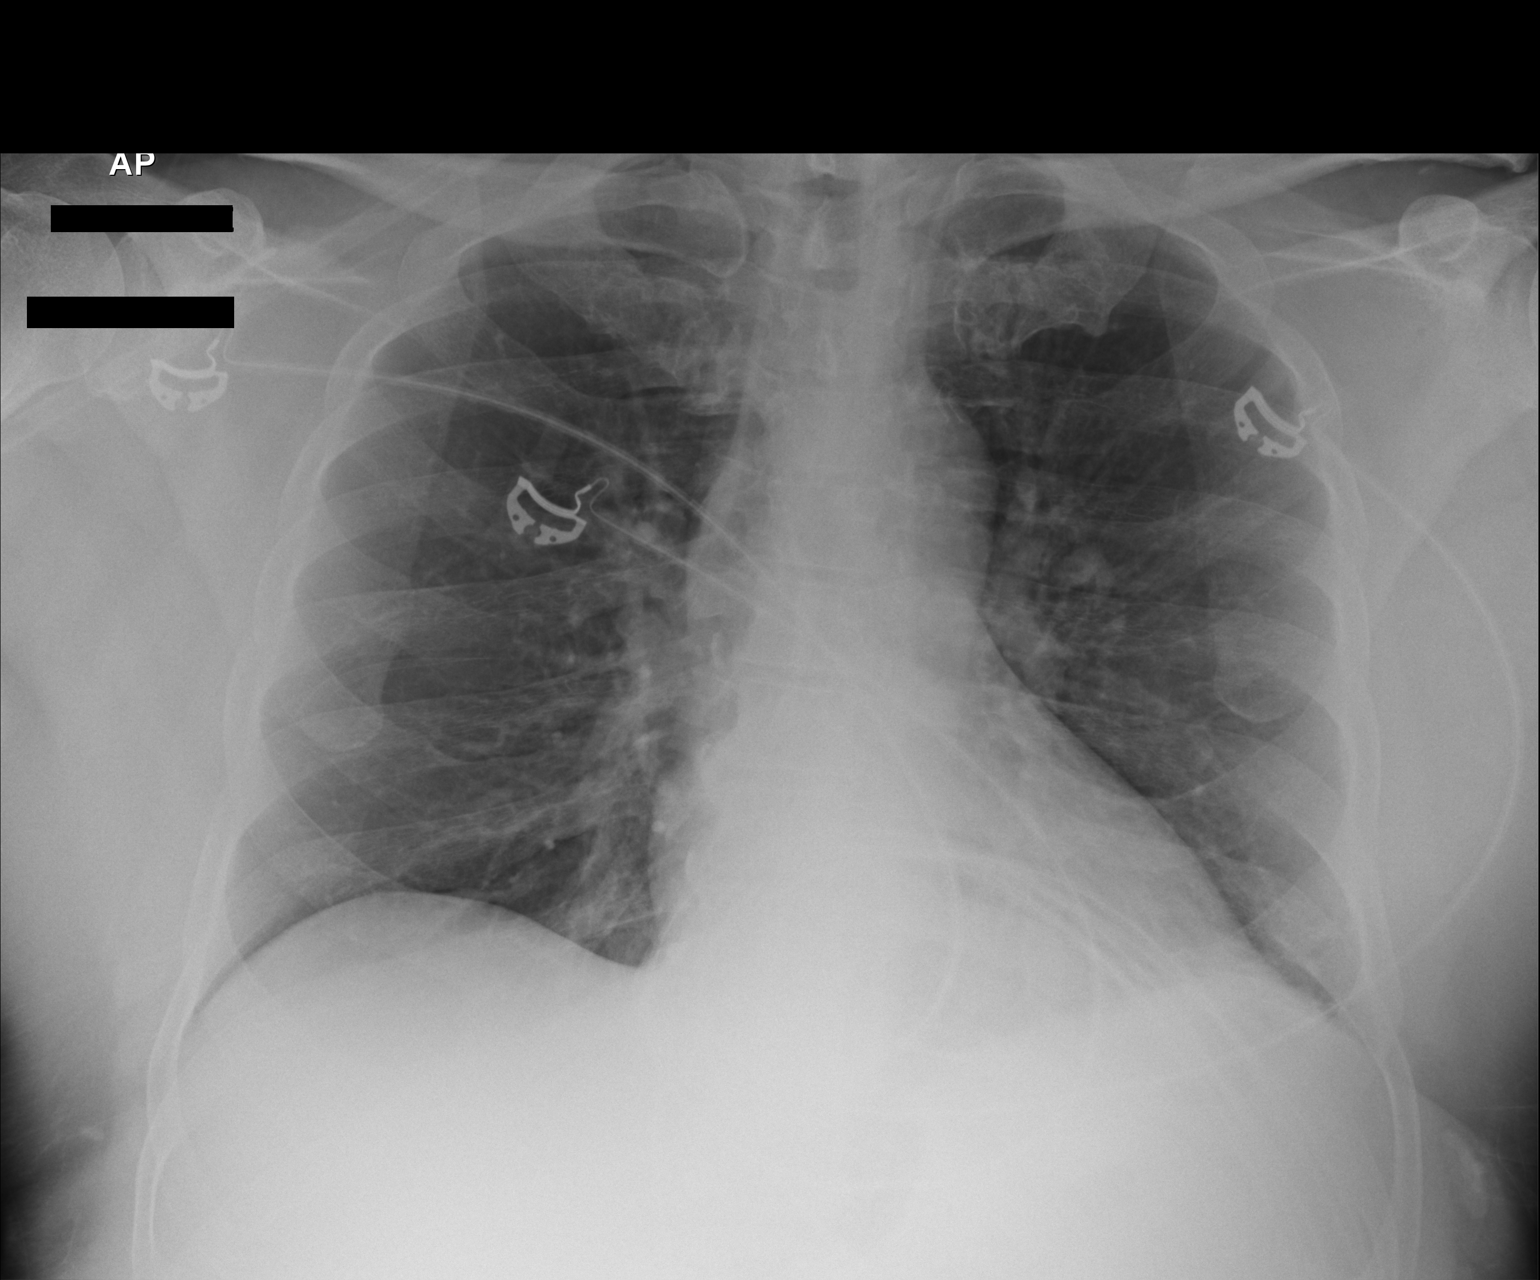

[1 of 1 positions shown; findings below may reference images not displayed]

FINDINGS: The heart size and mediastinal contours are within normal limits.
Both lungs are clear. The visualized skeletal structures are
unremarkable.
IMPRESSION: Normal chest.

## 2017-03-13 ENCOUNTER — Other Ambulatory Visit: Payer: Self-pay

## 2017-03-13 MED ORDER — PRAVASTATIN SODIUM 20 MG PO TABS: 20.0000 mg | ORAL_TABLET | Freq: Every day | ORAL | 1 refills | Status: DC

## 2017-03-13 MED FILL — LISINOPRIL-HCTZ 20-25 MG TA: 20-25 | 90 days supply | Qty: 90 | Fill #1 | Status: TO

## 2018-01-26 ENCOUNTER — Other Ambulatory Visit: Payer: Self-pay | Admitting: Medical

## 2018-01-27 NOTE — Telephone Encounter (Signed)
Okay to refill? Last seen 2018. Thanks!

## 2018-01-27 NOTE — Telephone Encounter (Signed)
Send 30 day supply and get in for Physical appt

## 2018-12-12 ENCOUNTER — Encounter: Payer: Self-pay | Admitting: Gastroenterology

## 2019-05-03 ENCOUNTER — Encounter: Payer: Self-pay | Admitting: Medical

## 2019-05-03 ENCOUNTER — Ambulatory Visit (INDEPENDENT_AMBULATORY_CARE_PROVIDER_SITE_OTHER): Payer: PRIVATE HEALTH INSURANCE | Admitting: Medical

## 2019-05-03 ENCOUNTER — Other Ambulatory Visit: Payer: Self-pay

## 2019-05-03 VITALS — BP 140/90 | HR 116 | Temp 99.0°F | Resp 16 | Ht 72.0 in | Wt 284.0 lb

## 2019-05-03 DIAGNOSIS — Z9119 Patient's noncompliance with other medical treatment and regimen: Secondary | ICD-10-CM | POA: Diagnosis not present

## 2019-05-03 DIAGNOSIS — I10 Essential (primary) hypertension: Secondary | ICD-10-CM

## 2019-05-03 DIAGNOSIS — E669 Obesity, unspecified: Secondary | ICD-10-CM | POA: Diagnosis not present

## 2019-05-03 DIAGNOSIS — Z91199 Patient's noncompliance with other medical treatment and regimen due to unspecified reason: Secondary | ICD-10-CM

## 2019-05-03 DIAGNOSIS — R000 Tachycardia, unspecified: Secondary | ICD-10-CM | POA: Insufficient documentation

## 2019-05-03 DIAGNOSIS — R0683 Snoring: Secondary | ICD-10-CM

## 2019-05-03 MED ORDER — ROSUVASTATIN CALCIUM 20 MG PO TABS: 20.0000 mg | ORAL_TABLET | Freq: Every day | ORAL | 3 refills | Status: DC

## 2019-05-03 MED ORDER — LISINOPRIL-HYDROCHLOROTHIAZIDE 20-25 MG PO TABS: 1.0000 | ORAL_TABLET | Freq: Every day | ORAL | 0 refills | Status: DC

## 2019-05-03 MED ORDER — ASPIRIN EC 81 MG PO TBEC: 81.0000 mg | DELAYED_RELEASE_TABLET | Freq: Every day | ORAL | 3 refills | Status: DC

## 2019-05-03 NOTE — Patient Instructions (Signed)
Exercise: I recommend exercising most days of the week using a type of exercise that you would enjoy and stick to such as walking, running, swimming, hiking, biking, aerobics, etc. This needs to be at least 30-40 minutes at a time, at least 5 days/week with moderate intensity.  This would be 60-70% of your maximum heart rate.  For example, your maximal heart rate would be 200- your age, multiplied x 0.6 to equal 60% of your maximal heart rate.    Thus, a person age 62 would have a maximum heart rate of 160.  60% of this would be 96 beats per minute.  Thus for fat burning exercise, a 63 year old would want to exercise has sustained heart rate of about 96 bpm for fat burning.  However for heart health, they would want to exercise at a higher heart rate of about 75% of maximum heart rate which would be a heart rate of about 120 beats per minute.  So I recommend a combination of doing some exercise at moderate intensity, and some exercise at vigorous intensity for heart health.   Low Carb Diet recommendations:  I recommend you drink water throughout the day.   70 ounces or 2 liters would be a good amount.  If you have been accustomed to drinking juice or soda, try water with lemon or water with lime, or try using no calorie flavor dropper.  I recommend the following as an example meal plan that includes 3 meals per day.   You can skip some meals periodically for intermittent fasting.  Breakfast (choose one): Marland Kitchen Omelette with egg, can include a little bit of cheese, your choice of mushrooms, peppers, onions, salsa . Smoothie with handful of spinach or kale, 1 cup of milk or water, 1 cup of berries, 1 packet of artificial sweetener such as stevia . Yogurt with fruit   Mid morning snack: . 1 fruit serving and 1 protein such as 8 almonds or 8 nuts, or vegetable such as carrots and humus or other similar vegetable   Lunch: . Salad with 3-4 ounces of lean grilled or baked meat such as fish, chicken or  [place]   Mid afternoon snack: . 1 fruit serving and 1 protein such as 8 almonds or 8 nuts, or vegetable such as carrots and humus or other similar vegetable   Dinner: . Large serving of vegetables and 3-4 ounces of lean grilled or baked meat such as fish, chicken or [place] . Or vegetarian dish without meat    Avoid  . Chips, cookies, cake, donuts, soda, sweet tea, juices, candy, fast food . For now , avoid, or significantly limit grains      Hypertension, Adult Hypertension is another name for high blood pressure. High blood pressure forces your heart to work harder to pump blood. This can cause problems over time. There are two numbers in a blood pressure reading. There is a top number (systolic) over a bottom number (diastolic). It is best to have a blood pressure that is below 120/80. Healthy choices can help lower your blood pressure, or you may need medicine to help lower it. What are the causes? The cause of this condition is not known. Some conditions may be related to high blood pressure. What increases the risk?  Smoking.  Having type 2 diabetes mellitus, high cholesterol, or both.  Not getting enough exercise or physical activity.  Being overweight.  Having too much fat, sugar, calories, or salt (sodium) in your diet.  Drinking too  much alcohol.  Having long-term (chronic) kidney disease.  Having a family history of high blood pressure.  Age. Risk increases with age.  Race. You may be at higher risk if you are African American.  Gender. Men are at higher risk than women before age 63. After age 83, women are at higher risk than men.  Having obstructive sleep apnea.  Stress. What are the signs or symptoms?  High blood pressure may not cause symptoms. Very high blood pressure (hypertensive crisis) may cause: ? Headache. ? Feelings of worry or nervousness (anxiety). ? Shortness of breath. ? Nosebleed. ? A feeling of being sick to your stomach  (nausea). ? Throwing up (vomiting). ? Changes in how you see. ? Very bad chest pain. ? Seizures. How is this treated?  This condition is treated by making healthy lifestyle changes, such as: ? Eating healthy foods. ? Exercising more. ? Drinking less alcohol.  Your health care provider may prescribe medicine if lifestyle changes are not enough to get your blood pressure under control, and if: ? Your top number is above 130. ? Your bottom number is above 80.  Your personal target blood pressure may vary. Follow these instructions at home: Eating and drinking   If told, follow the DASH eating plan. To follow this plan: ? Fill one half of your plate at each meal with fruits and vegetables. ? Fill one fourth of your plate at each meal with whole grains. Whole grains include whole-wheat pasta, brown rice, and whole-grain bread. ? Eat or drink low-fat dairy products, such as skim milk or low-fat yogurt. ? Fill one fourth of your plate at each meal with low-fat (lean) proteins. Low-fat proteins include fish, chicken without skin, eggs, beans, and tofu. ? Avoid fatty meat, cured and processed meat, or chicken with skin. ? Avoid pre-made or processed food.  Eat less than 1,500 mg of salt each day.  Do not drink alcohol if: ? Your doctor tells you not to drink. ? You are pregnant, may be pregnant, or are planning to become pregnant.  If you drink alcohol: ? Limit how much you use to:  0-1 drink a day for women.  0-2 drinks a day for men. ? Be aware of how much alcohol is in your drink. In the [place] one drink equals one 12 oz bottle of beer (355 mL), one 5 oz glass of wine (148 mL), or one 1 oz glass of hard liquor (44 mL). Lifestyle   Work with your doctor to stay at a healthy weight or to lose weight. Ask your doctor what the best weight is for you.  Get at least 30 minutes of exercise most days of the week. This may include walking, swimming, or biking.  Get at least 30  minutes of exercise that strengthens your muscles (resistance exercise) at least 3 days a week. This may include lifting weights or doing Pilates.  Do not use any products that contain nicotine or tobacco, such as cigarettes, e-cigarettes, and chewing tobacco. If you need help quitting, ask your doctor.  Check your blood pressure at home as told by your doctor.  Keep all follow-up visits as told by your doctor. This is important. Medicines  Take over-the-counter and prescription medicines only as told by your doctor. Follow directions carefully.  Do not skip doses of blood pressure medicine. The medicine does not work as well if you skip doses. Skipping doses also puts you at risk for problems.  Ask your doctor about  side effects or reactions to medicines that you should watch for. Contact a doctor if you:  Think you are having a reaction to the medicine you are taking.  Have headaches that keep coming back (recurring).  Feel dizzy.  Have swelling in your ankles.  Have trouble with your vision. Get help right away if you:  Get a very bad headache.  Start to feel mixed up (confused).  Feel weak or numb.  Feel faint.  Have very bad pain in your: ? Chest. ? Belly (abdomen).  Throw up more than once.  Have trouble breathing. Summary  Hypertension is another name for high blood pressure.  High blood pressure forces your heart to work harder to pump blood.  For most people, a normal blood pressure is less than 120/80.  Making healthy choices can help lower blood pressure. If your blood pressure does not get lower with healthy choices, you may need to take medicine. This information is not intended to replace advice given to you by your health care provider. Make sure you discuss any questions you have with your health care provider. Document Released: 03/18/2008 Document Revised: 06/10/2018 Document Reviewed: 06/10/2018 Elsevier Patient Education  2020 [employer].

## 2019-05-03 NOTE — Progress Notes (Signed)
Subjective: Chief Complaint  Patient presents with  . med check    med check/blood pressure    Here for med check.  Last visit 12/2016.  Looking back in the chart record he is coming about every 2 years the last couple visits.  He notes that he is just been lazy with his medication.  He notes that he has not taken his blood pressure medicine about the last year.  He retired about a year ago.  Last year they moved out to [place] into a new house with a lot more yard to keep and he has been busy doing projects around the house.  Overall he feels fine.  No particular complaints.  He has been taking a testosterone booster over-the-counter.  He denies chest pain, shortness of breath, palpitations.  He does snore at times but no witnessed apnea.  He gives no other reason for why he does not take his blood pressure pill.  No other complaint.  Past Medical History:  Diagnosis Date  . 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 Asthma    rare wheezing  . Hypertension 2010  . Kidney stone   . Nut allergy   . Obesity   . Wears glasses    No current outpatient medications on file prior to visit.   No current facility-administered medications on file prior to visit.    Family History  Problem Relation Age of Onset  . Other Mother        pneumonia  . Cancer Father        lung  . Cancer Brother        lung  . Alcohol abuse Brother   . Heart disease Brother 14       CABG  . Hypertension Brother   . Colon polyps Brother   . Colon cancer Paternal Grandfather   . Esophageal cancer Neg Hx   . Rectal cancer Neg Hx   . Stomach cancer Neg Hx    ROS as in subjective    Objective: BP 140/90   Pulse (!) 116   Temp 99 F (37.2 C) (Oral)   Resp 16   Ht 6' (1.829 m)   Wt 284 lb (128.8 kg)   SpO2 96%   BMI 38.52 kg/m   Wt Readings from Last 3 Encounters:  05/03/19 284 lb (128.8 kg)  01/20/17 264 lb (119.7 kg)  12/31/16 264 lb 12.8 oz (120.1 kg)   BP Readings from Last 3  Encounters:  05/03/19 140/90  01/20/17 (!) 157/102  12/31/16 (!) 138/94   General appearance: alert, no distress, WD/WN,  Neck: supple, no lymphadenopathy, no thyromegaly, no masses, no bruits Heart: RRR, normal S1, S2, no murmurs Lungs: CTA bilaterally, no wheezes, rhonchi, or rales Abdomen: +bs, soft, non tender, non distended, no masses, no hepatomegaly, no splenomegaly Pulses: 2+ symmetric, upper and lower extremities, normal cap refill Ext: no edema  EKG Rate 107 bpm, PR interval 152 ms, QRS duration 94 ms, QTC 461 ms, axis -47 degrees, sinus tachycardia, left atrial enlargement, left anterior fascicular block, no acute changes compared to 2017 EKG    Assessment: Encounter Diagnoses  Name Primary?  . Essential hypertension Yes  . Noncompliance   . Obesity without serious comorbidity, unspecified classification, unspecified obesity type   . Tachycardia   . Snoring      Plan: Looking back over his chart history, it is obvious that he has been mostly noncompliant.  He has  come in once for a visit in the past 2 years and then prior to that had come in 2 years prior to that visit as well.  I believe both times he had come in mainly on the urging of his wife.  So it is not clear if he even took his medication either time.  He states he took his medication but we really do not know.  As of today he says the last time he took blood pressure medicine was at least a year ago.  I made it very clear what the complications were of uncontrolled high blood pressure including damage to the kidney, heart failure, heart attack, stroke, damage to the eye, kidney disease, dialysis, congestive heart failure.  We discussed the things that he can do to work on improving his situation including weight loss, regular exercise, limiting salt, making diet and exercise changes.  We discussed the EKG findings.  We will do updated labs today.  We discussed sleep apnea is a possibility.  He will talk with his  wife about whether he has snoring or other sleep apnea symptoms.  I recommended he go back on medications we have prescribed in the past.  Follow-up pending labs    Gust was seen today for med check.  Diagnoses and all orders for this visit:  Essential hypertension -     EKG 12-Lead -     Basic metabolic panel -     TSH -     CBC with Differential/Platelet  Noncompliance  Obesity without serious comorbidity, unspecified classification, unspecified obesity type  Tachycardia -     EKG 12-Lead -     Basic metabolic panel -     TSH -     CBC with Differential/Platelet  Snoring  Other orders -     lisinopril-hydrochlorothiazide (ZESTORETIC) 20-25 MG tablet; Take 1 tablet by mouth daily. -     aspirin EC 81 MG tablet; Take 1 tablet (81 mg total) by mouth daily. -     rosuvastatin (CRESTOR) 20 MG tablet; Take 1 tablet (20 mg total) by mouth daily.

## 2019-05-04 LAB — CBC WITH DIFFERENTIAL/PLATELET
Basophils Absolute: 0.1 10*3/uL (ref 0.0–0.2)
Basos: 1 %
EOS (ABSOLUTE): 0.3 10*3/uL (ref 0.0–0.4)
Eos: 3 %
Hematocrit: 42.3 % (ref 37.5–51.0)
Hemoglobin: 14.4 g/dL (ref 13.0–17.7)
Immature Grans (Abs): 0 10*3/uL (ref 0.0–0.1)
Immature Granulocytes: 0 %
Lymphocytes Absolute: 2.2 10*3/uL (ref 0.7–3.1)
Lymphs: 28 %
MCH: 31.2 pg (ref 26.6–33.0)
MCHC: 34 g/dL (ref 31.5–35.7)
MCV: 92 fL (ref 79–97)
Monocytes Absolute: 0.8 10*3/uL (ref 0.1–0.9)
Monocytes: 10 %
Neutrophils Absolute: 4.6 10*3/uL (ref 1.4–7.0)
Neutrophils: 58 %
Platelets: 223 10*3/uL (ref 150–450)
RBC: 4.62 x10E6/uL (ref 4.14–5.80)
RDW: 11.5 % — ABNORMAL LOW (ref 11.6–15.4)
WBC: 8 10*3/uL (ref 3.4–10.8)

## 2019-05-04 LAB — BASIC METABOLIC PANEL
BUN/Creatinine Ratio: 8 — ABNORMAL LOW (ref 10–24)
BUN: 8 mg/dL (ref 8–27)
CO2: 23 mmol/L (ref 20–29)
Calcium: 9.9 mg/dL (ref 8.6–10.2)
Chloride: 102 mmol/L (ref 96–106)
Creatinine, Ser: 1.06 mg/dL (ref 0.76–1.27)
GFR calc Af Amer: 87 mL/min/{1.73_m2} (ref >59)
GFR calc non Af Amer: 75 mL/min/{1.73_m2} (ref >59)
Glucose: 83 mg/dL (ref 65–99)
Potassium: 3.8 mmol/L (ref 3.5–5.2)
Sodium: 141 mmol/L (ref 134–144)

## 2019-05-04 LAB — TSH: TSH: 2.88 u[IU]/mL (ref 0.450–4.500)

## 2019-05-11 NOTE — Progress Notes (Signed)
Labs ok. I recommend he start back on the 3 medications, blood pressure medication, cholesterol medication and aspirin.    I also recommend a referral to cardiology to further evaluate his heart given EKG findings.  If agreeable, refer to cardiology.   I recommend follow up in 1 month for recheck on BP and physical, fasting

## 2019-07-30 ENCOUNTER — Other Ambulatory Visit: Payer: Self-pay | Admitting: Medical

## 2019-10-27 ENCOUNTER — Other Ambulatory Visit: Payer: Self-pay | Admitting: Medical

## 2020-01-27 ENCOUNTER — Other Ambulatory Visit: Payer: Self-pay | Admitting: Medical

## 2020-03-01 ENCOUNTER — Other Ambulatory Visit: Payer: Self-pay | Admitting: Medical

## 2020-03-01 NOTE — Telephone Encounter (Signed)
Tried contacting patient about scheduling an appointment. Patient vm has not been set up yet. Please advise refill

## 2020-03-01 NOTE — Telephone Encounter (Signed)
Try again.   Refill sent

## 2020-03-02 ENCOUNTER — Encounter: Payer: Self-pay | Admitting: Medical

## 2020-03-02 NOTE — Telephone Encounter (Signed)
Okay send letter for request for appointment

## 2020-03-02 NOTE — Telephone Encounter (Signed)
Tried calling again, the person I am trying to reach has a vm that has not been set up.

## 2020-03-02 NOTE — Telephone Encounter (Signed)
Letter has been sent

## 2020-04-03 ENCOUNTER — Other Ambulatory Visit: Payer: Self-pay | Admitting: Medical

## 2020-04-30 ENCOUNTER — Other Ambulatory Visit: Payer: Self-pay | Admitting: Medical

## 2020-05-14 ENCOUNTER — Other Ambulatory Visit: Payer: Self-pay | Admitting: Medical

## 2020-05-15 ENCOUNTER — Encounter: Payer: Self-pay | Admitting: Internal Medicine

## 2020-05-15 NOTE — Telephone Encounter (Signed)
Sent letter

## 2020-05-15 NOTE — Telephone Encounter (Signed)
Tried to call but number listed is wrong number.

## 2020-05-31 ENCOUNTER — Other Ambulatory Visit: Payer: Self-pay | Admitting: Medical

## 2020-05-31 NOTE — Telephone Encounter (Signed)
Given 30 day supply, set up CPX fasting

## 2020-06-01 NOTE — Telephone Encounter (Signed)
We have been unable to reach patient using number in chart. A letter has been mailed to the patient to call and schedule an appointment.

## 2020-07-13 ENCOUNTER — Telehealth: Payer: Self-pay | Admitting: Medical

## 2020-07-13 ENCOUNTER — Other Ambulatory Visit: Payer: Self-pay

## 2020-07-13 MED ORDER — LISINOPRIL-HYDROCHLOROTHIAZIDE 20-25 MG PO TABS: ORAL_TABLET | ORAL | 0 refills | Status: DC

## 2020-07-13 MED ORDER — ROSUVASTATIN CALCIUM 20 MG PO TABS: 20.0000 mg | ORAL_TABLET | Freq: Every day | ORAL | 0 refills | Status: DC

## 2020-07-13 NOTE — Telephone Encounter (Signed)
Medication has been sent to the pharmacy. 

## 2020-07-13 NOTE — Telephone Encounter (Signed)
Pt needs refill Rosuvastatin & Lisinopril to [facility],  Made pt Med ck appt,  Will set up CPE when here for that appt

## 2020-07-19 NOTE — Telephone Encounter (Signed)
done

## 2020-07-24 ENCOUNTER — Ambulatory Visit: Payer: 59 | Admitting: Medical

## 2020-07-24 ENCOUNTER — Encounter: Payer: PRIVATE HEALTH INSURANCE | Admitting: Medical

## 2020-07-24 ENCOUNTER — Other Ambulatory Visit: Payer: Self-pay

## 2020-07-24 ENCOUNTER — Encounter: Payer: Self-pay | Admitting: Medical

## 2020-07-24 VITALS — BP 138/94 | HR 118 | Ht 72.0 in | Wt 280.0 lb

## 2020-07-24 DIAGNOSIS — C61 Malignant neoplasm of prostate: Secondary | ICD-10-CM | POA: Insufficient documentation

## 2020-07-24 DIAGNOSIS — R000 Tachycardia, unspecified: Secondary | ICD-10-CM

## 2020-07-24 DIAGNOSIS — Z13220 Encounter for screening for lipoid disorders: Secondary | ICD-10-CM

## 2020-07-24 DIAGNOSIS — Z0000 Encounter for general adult medical examination without abnormal findings: Secondary | ICD-10-CM

## 2020-07-24 DIAGNOSIS — Z23 Encounter for immunization: Secondary | ICD-10-CM

## 2020-07-24 DIAGNOSIS — Z125 Encounter for screening for malignant neoplasm of prostate: Secondary | ICD-10-CM

## 2020-07-24 DIAGNOSIS — N400 Enlarged prostate without lower urinary tract symptoms: Secondary | ICD-10-CM

## 2020-07-24 DIAGNOSIS — I10 Essential (primary) hypertension: Secondary | ICD-10-CM | POA: Diagnosis not present

## 2020-07-24 DIAGNOSIS — Z131 Encounter for screening for diabetes mellitus: Secondary | ICD-10-CM | POA: Insufficient documentation

## 2020-07-24 DIAGNOSIS — Z636 Dependent relative needing care at home: Secondary | ICD-10-CM

## 2020-07-24 DIAGNOSIS — Z7185 Encounter for immunization safety counseling: Secondary | ICD-10-CM

## 2020-07-24 NOTE — Progress Notes (Signed)
Complete physical exam   Patient: Charles Moore   DOB: 04/16/1956   64 y.o. Male  MRN: [id number] Visit Date: 07/24/2020  Today's healthcare provider: Dorothea Ogle, PA-C  Care Team Orthopedics: Dr. Joni Fears Gastroenterology:Dr. Tarboro Cellar   Chief Complaint  Patient presents with  . Annual Exam    not fasting   I,Charles Moore,acting as a Education administrator for [employer], PA-C.,have documented all relevant documentation on his behalf,as directed and in the presence of [employer].  Subjective    Charles Moore is a 64 y.o. male who presents today for a complete physical exam.  He generally feels well.  HPI HPI    Annual Exam     Additional comments: not fasting        Last edited by Edgar Frisk, [place] on 07/24/2020 12:20 PM. (History)      Hypertension Patient is currently taking Zestoretic 20-25 MG. He reports checking blood pressure and arranges are 130's/90's. BP Readings from Last 3 Encounters:  07/24/20 (!) 138/94  05/03/19 140/90  01/20/17 (!) 157/102    Patient is currently taking care of his brother that has stage 4 cancer (brain tumor).   Past Medical History:  Diagnosis Date  . 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 Asthma    rare wheezing  . Hypertension 2010  . Kidney stone   . Nut allergy   . Obesity   . Wears glasses    Past Surgical History:  Procedure Laterality Date  . LITHOTRIPSY      Family Status  Relation Name Status  . Mother  Deceased  . Father  Deceased  . Brother percy Deceased  . Brother Data processing manager  . Brother OC Alive  . Sister patricia Alive  . Brother [employer]  . Brother [employer]  . PGF  (Not Specified)  . Neg Hx  (Not Specified)   Family History  Problem Relation Age of Onset  . Other Mother        pneumonia  . Cancer Father        lung  . Cancer Brother        lung  . Alcohol abuse Brother   . Heart disease Brother 73       CABG  . Hypertension Brother   .  Colon polyps Brother   . Colon cancer Paternal Grandfather   . Esophageal cancer Neg Hx   . Rectal cancer Neg Hx   . Stomach cancer Neg Hx    Allergies  Allergen Reactions  . Other Anaphylaxis    Almonds, cashews, [place] nuts    Patient Care Team: Tysinger, Camelia Eng, PA-C as PCP - General (Family Medicine)   Medications: Outpatient Medications Prior to Visit  Medication Sig  . aspirin EC 81 MG tablet Take 1 tablet (81 mg total) by mouth daily.  Marland Kitchen lisinopril-hydrochlorothiazide (ZESTORETIC) 20-25 MG tablet TAKE ONE TABLET BY MOUTH DAILY; MAKE DOCTOR'S APPOINTMENT FOR REFILLS  . rosuvastatin (CRESTOR) 20 MG tablet Take 1 tablet (20 mg total) by mouth daily.   No facility-administered medications prior to visit.    Review of Systems Review of Systems Constitutional: -fever, -chills, -sweats, -unexpected weight change, -anorexia, -fatigue Allergy: -sneezing, -itching, -congestion Dermatology: denies changing moles, rash, lumps, new worrisome lesions ENT: -runny nose, -ear pain, -sore throat, -hoarseness, -sinus pain, -teeth pain, -tinnitus, -hearing loss, -epistaxis Cardiology:  -chest pain, -palpitations, -edema, -orthopnea, -paroxysmal nocturnal dyspnea Respiratory: -cough, -shortness  of breath, -dyspnea on exertion, -wheezing, -hemoptysis Gastroenterology: -abdominal pain, -nausea, -vomiting, -diarrhea, -constipation, -blood in stool, -changes in bowel movement, -dysphagia Hematology: -bleeding or bruising problems Musculoskeletal: -arthralgias, -myalgias, -joint swelling, -back pain, -neck pain, -cramping, -gait changes Ophthalmology: -vision changes, -eye redness, -itching, -discharge Urology: -dysuria, -difficulty urinating, -hematuria, -urinary frequency, -urgency, incontinence Neurology: -headache, -weakness, -tingling, -numbness, -speech abnormality, -memory loss, -falls, -dizziness Psychology:  -depressed mood, -agitation, -sleep problems    Objective    BP (!) 138/94    Pulse (!) 118   Ht 6' (1.829 m)   Wt 280 lb (127 kg)   SpO2 96%   BMI 37.97 kg/m    Physical Exam Constitutional:      Appearance: Normal appearance. He is obese.  Eyes:     Extraocular Movements: Extraocular movements intact.     Conjunctiva/sclera: Conjunctivae normal.     Pupils: Pupils are equal, round, and reactive to light.  Neck:     Vascular: No carotid bruit.  Cardiovascular:     Rate and Rhythm: Normal rate and regular rhythm.     Heart sounds: No murmur heard.  No gallop.   Pulmonary:     Effort: Pulmonary effort is normal. No respiratory distress.     Breath sounds: Normal breath sounds. No wheezing, rhonchi or rales.  Abdominal:     General: Abdomen is flat. Bowel sounds are normal.     Palpations: Abdomen is soft.  Genitourinary:    Comments: Deferred/declined GU and rectal exam today Musculoskeletal:     Cervical back: Normal range of motion and neck supple. No rigidity.  Skin:    General: Skin is warm and dry.     Capillary Refill: Capillary refill takes less than 2 seconds.  Neurological:     General: No focal deficit present.     Mental Status: He is alert and oriented to person, place, and time.  Psychiatric:        Mood and Affect: Mood normal.        Behavior: Behavior normal.      Last depression screening scores PHQ 2/9 Scores 07/24/2020 05/03/2019 08/29/2015  PHQ - 2 Score 0 0 0   Last fall risk screening Fall Risk  08/29/2015  Falls in the past year? No      Assessment & Plan   Encounter Diagnoses  Name Primary?  . Routine general medical examination at a health care facility Yes  . Essential hypertension   . Benign prostatic hyperplasia without lower urinary tract symptoms   . Screening for diabetes mellitus   . Screening for lipid disorders   . Sinus tachycardia   . Screening for prostate cancer   . Need for influenza vaccination   . Vaccine counseling   . Caregiver burden       Patient was advised that his heart rate  has been elevated at recent visits. He was advised to check pulse rates daily to see if pulse rates are elevated to probably be send to a cardiologist.   Routine Health Maintenance and Physical Exam  Exercise Activities and Dietary recommendations Goals   None     Immunization History  Administered Date(s) Administered  . Influenza,inj,Quad PF,6+ Mos 07/24/2020  . Influenza-Unspecified 07/03/2015  . Tdap 08/15/2011    Health Maintenance  Topic Date Due  . Hepatitis C Screening  Never done  . COVID-19 Vaccine (1) Never done  . HIV Screening  Never done  . COLONOSCOPY  11/30/2018  . TETANUS/TDAP  08/14/2021  .  INFLUENZA VACCINE  Completed     Physical exam - discussed and counseled on healthy lifestyle, diet, exercise, preventative care, vaccinations, sick and well care, proper use of emergency dept and after hours care, and addressed their concerns.    Health screening: See your eye doctor yearly for routine vision care. See your dentist yearly for routine dental care including hygiene visits twice yearly.  Cancer screening Colonoscopy:  Reviewed colonoscopy on file that is up to date   Discussed PSA, prostate exam, and prostate cancer screening risks/benefits.      Vaccinations: Advised yearly influenza vaccine Counseled on the influenza virus vaccine.  Vaccine information sheet given.  Influenza vaccine given after consent obtained.  He notes up to date on Shingrix, Covid, Hep A and B vaccines this year.  We will [facility] copy of vaccines [facility]   Acute issues discussed: Expressed by empathy for his situation with caregiver of brother who is not doing well, has stage 4 cancer   Separate significant chronic issues discussed: Blood pressure and pulse elevated today.  We discussed possibility of whitecoat hypertension.  His pulse rate was elevated last year as well.  I reviewed EKG from last year.  I asked him to start checking home blood  pressure and pulse we can get an idea if this is just whitecoat related  Consider modifying medicine or adding beta-blocker.  Consider baseline cardiac evaluation  Routine labs today  Brentlee was seen today for annual exam.  Diagnoses and all orders for this visit:  Routine general medical examination at a health care facility -     Lipid panel -     Comprehensive metabolic panel -     CBC with Differential/Platelet -     TSH -     Hemoglobin A1c -     PSA -     Microalbumin / creatinine urine ratio  Essential hypertension -     Microalbumin / creatinine urine ratio  Benign prostatic hyperplasia without lower urinary tract symptoms -     PSA  Screening for diabetes mellitus  Screening for lipid disorders -     Lipid panel  Sinus tachycardia -     TSH  Screening for prostate cancer -     PSA  Need for influenza vaccination  Vaccine counseling  Caregiver burden  Other orders -     Flu Vaccine QUAD 6+ mos PF IM (Fluarix Quad PF)    Follow-up pending labs, yearly for physical

## 2020-07-25 LAB — COMPREHENSIVE METABOLIC PANEL
ALT: 42 IU/L (ref 0–44)
AST: 34 IU/L (ref 0–40)
Albumin/Globulin Ratio: 1.5 (ref 1.2–2.2)
Albumin: 4.7 g/dL (ref 3.8–4.8)
Alkaline Phosphatase: 76 IU/L (ref 44–121)
BUN/Creatinine Ratio: 9 — ABNORMAL LOW (ref 10–24)
BUN: 10 mg/dL (ref 8–27)
Bilirubin Total: 0.7 mg/dL (ref 0.0–1.2)
CO2: 24 mmol/L (ref 20–29)
Calcium: 10.2 mg/dL (ref 8.6–10.2)
Chloride: 103 mmol/L (ref 96–106)
Creatinine, Ser: 1.07 mg/dL (ref 0.76–1.27)
GFR calc Af Amer: 85 mL/min/{1.73_m2} (ref >59)
GFR calc non Af Amer: 73 mL/min/{1.73_m2} (ref >59)
Globulin, Total: 3.1 g/dL (ref 1.5–4.5)
Glucose: 96 mg/dL (ref 65–99)
Potassium: 3.6 mmol/L (ref 3.5–5.2)
Sodium: 142 mmol/L (ref 134–144)
Total Protein: 7.8 g/dL (ref 6.0–8.5)

## 2020-07-25 LAB — LIPID PANEL
Chol/HDL Ratio: 2.5 ratio (ref 0.0–5.0)
Cholesterol, Total: 93 mg/dL — ABNORMAL LOW (ref 100–199)
HDL: 37 mg/dL — ABNORMAL LOW (ref >39)
LDL Chol Calc (NIH): 42 mg/dL (ref 0–99)
Triglycerides: 61 mg/dL (ref 0–149)
VLDL Cholesterol Cal: 14 mg/dL (ref 5–40)

## 2020-07-25 LAB — CBC WITH DIFFERENTIAL/PLATELET
Basophils Absolute: 0.1 10*3/uL (ref 0.0–0.2)
Basos: 1 %
EOS (ABSOLUTE): 0.3 10*3/uL (ref 0.0–0.4)
Eos: 3 %
Hematocrit: 46.2 % (ref 37.5–51.0)
Hemoglobin: 15.1 g/dL (ref 13.0–17.7)
Immature Grans (Abs): 0 10*3/uL (ref 0.0–0.1)
Immature Granulocytes: 0 %
Lymphocytes Absolute: 2.7 10*3/uL (ref 0.7–3.1)
Lymphs: 30 %
MCH: 30.8 pg (ref 26.6–33.0)
MCHC: 32.7 g/dL (ref 31.5–35.7)
MCV: 94 fL (ref 79–97)
Monocytes Absolute: 0.8 10*3/uL (ref 0.1–0.9)
Monocytes: 8 %
Neutrophils Absolute: 5.2 10*3/uL (ref 1.4–7.0)
Neutrophils: 58 %
Platelets: 233 10*3/uL (ref 150–450)
RBC: 4.9 x10E6/uL (ref 4.14–5.80)
RDW: 11.3 % — ABNORMAL LOW (ref 11.6–15.4)
WBC: 9.1 10*3/uL (ref 3.4–10.8)

## 2020-07-25 LAB — MICROALBUMIN / CREATININE URINE RATIO
Creatinine, Urine: 211.4 mg/dL
Microalb/Creat Ratio: 61 mg/g creat — ABNORMAL HIGH (ref 0–29)
Microalbumin, Urine: 128.1 ug/mL

## 2020-07-25 LAB — HEMOGLOBIN A1C
Est. average glucose Bld gHb Est-mCnc: 105 mg/dL
Hgb A1c MFr Bld: 5.3 % (ref 4.8–5.6)

## 2020-07-25 LAB — PSA: Prostate Specific Ag, Serum: 6.2 ng/mL — ABNORMAL HIGH (ref 0.0–4.0)

## 2020-07-25 LAB — TSH: TSH: 2.5 u[IU]/mL (ref 0.450–4.500)

## 2020-07-27 ENCOUNTER — Telehealth: Payer: Self-pay | Admitting: Medical

## 2020-07-27 NOTE — Telephone Encounter (Signed)
Received requested records from [facility], [place]

## 2020-08-13 ENCOUNTER — Other Ambulatory Visit: Payer: Self-pay | Admitting: Medical

## 2020-09-19 ENCOUNTER — Other Ambulatory Visit: Payer: Self-pay

## 2020-09-19 ENCOUNTER — Telehealth: Payer: Self-pay | Admitting: Family Medicine

## 2020-09-19 MED ORDER — LISINOPRIL-HYDROCHLOROTHIAZIDE 20-25 MG PO TABS: ORAL_TABLET | ORAL | 0 refills | Status: DC

## 2020-09-19 NOTE — Telephone Encounter (Signed)
Medication has been sent.  

## 2020-09-19 NOTE — Telephone Encounter (Signed)
[facility] req 90 days supply on Lisinopril hctz 20-25 mg tab

## 2020-10-14 DIAGNOSIS — C801 Malignant (primary) neoplasm, unspecified: Secondary | ICD-10-CM

## 2020-10-14 HISTORY — DX: Malignant (primary) neoplasm, unspecified: C80.1

## 2020-11-13 ENCOUNTER — Other Ambulatory Visit: Payer: Self-pay | Admitting: Medical

## 2020-12-18 ENCOUNTER — Other Ambulatory Visit: Payer: Self-pay | Admitting: Medical

## 2020-12-20 ENCOUNTER — Other Ambulatory Visit: Payer: Self-pay | Admitting: Medical

## 2020-12-20 NOTE — Telephone Encounter (Signed)
Lmom advising patient to call and schedule a med check appointment.

## 2021-01-20 ENCOUNTER — Other Ambulatory Visit: Payer: Self-pay | Admitting: Medical

## 2021-02-27 ENCOUNTER — Telehealth: Payer: Self-pay | Admitting: Medical

## 2021-02-27 ENCOUNTER — Other Ambulatory Visit: Payer: Self-pay | Admitting: Medical

## 2021-02-27 MED ORDER — LISINOPRIL-HYDROCHLOROTHIAZIDE 20-25 MG PO TABS: ORAL_TABLET | ORAL | 1 refills | Status: DC

## 2021-02-27 MED ORDER — ROSUVASTATIN CALCIUM 20 MG PO TABS: 20.0000 mg | ORAL_TABLET | Freq: Every day | ORAL | 1 refills | Status: DC

## 2021-02-27 NOTE — Telephone Encounter (Signed)
Meds sent

## 2021-02-27 NOTE — Telephone Encounter (Signed)
Pt called and is scheduled for Med check on 5/24 he isnt due for CPE until Dec, he needs a refill on Lisinopril and Rosuvastatin sent to the [facility] on [address]. He wanted to apologize for not coming in he has been having family issues and he said his brother recently died

## 2021-03-06 ENCOUNTER — Encounter: Payer: Self-pay | Admitting: Medical

## 2021-03-06 ENCOUNTER — Other Ambulatory Visit: Payer: Self-pay

## 2021-03-06 ENCOUNTER — Ambulatory Visit (INDEPENDENT_AMBULATORY_CARE_PROVIDER_SITE_OTHER): Payer: 59 | Admitting: Medical

## 2021-03-06 VITALS — BP 152/100 | HR 95 | Ht 72.0 in | Wt 282.2 lb

## 2021-03-06 DIAGNOSIS — E785 Hyperlipidemia, unspecified: Secondary | ICD-10-CM

## 2021-03-06 DIAGNOSIS — R972 Elevated prostate specific antigen [PSA]: Secondary | ICD-10-CM | POA: Diagnosis not present

## 2021-03-06 DIAGNOSIS — R809 Proteinuria, unspecified: Secondary | ICD-10-CM | POA: Diagnosis not present

## 2021-03-06 DIAGNOSIS — Z7185 Encounter for immunization safety counseling: Secondary | ICD-10-CM

## 2021-03-06 DIAGNOSIS — N400 Enlarged prostate without lower urinary tract symptoms: Secondary | ICD-10-CM | POA: Diagnosis not present

## 2021-03-06 DIAGNOSIS — I10 Essential (primary) hypertension: Secondary | ICD-10-CM | POA: Diagnosis not present

## 2021-03-06 MED ORDER — ROSUVASTATIN CALCIUM 20 MG PO TABS: 20.0000 mg | ORAL_TABLET | Freq: Every day | ORAL | 3 refills | Status: DC

## 2021-03-06 MED ORDER — LISINOPRIL-HYDROCHLOROTHIAZIDE 20-25 MG PO TABS: 1.0000 | ORAL_TABLET | Freq: Every day | ORAL | 3 refills | Status: DC

## 2021-03-06 MED ORDER — METOPROLOL SUCCINATE ER 50 MG PO TB24: 50.0000 mg | ORAL_TABLET | Freq: Every day | ORAL | 3 refills | Status: DC

## 2021-03-06 MED ORDER — ASPIRIN EC 81 MG PO TBEC: 81.0000 mg | DELAYED_RELEASE_TABLET | Freq: Every day | ORAL | 3 refills | Status: DC

## 2021-03-06 NOTE — Progress Notes (Signed)
Subjective:  Charles Moore is a 65 y.o. male who presents for Chief Complaint  Patient presents with  . Medication Management     Here for med check  Hypertension-she is compliant with lisinopril HCT.  After his last visit in October we tried to contact him by phone and mail that he did not respond.  Thus he never started the Toprol-XL we intended.  He denies chest pain, palpitations, edema, shortness of breath  His brother passed away in 01/20/2023 from cancer/brain tumor.  He was helping take care of him in his last months.  He is now doing with the financial and estate issues  Last visit his PSA was elevated but he was not able to be reached.  Here to discuss this today  No other aggravating or relieving factors.    No other c/o.   Past Medical History:  Diagnosis Date  . 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 Asthma    rare wheezing  . Hypertension 01/19/2009  . Kidney stone   . Nut allergy   . Obesity   . Wears glasses    No current outpatient medications on file prior to visit.   No current facility-administered medications on file prior to visit.     The following portions of the patient's history were reviewed and updated as appropriate: allergies, current medications, past family history, past medical history, past social history, past surgical history and problem list.  ROS Otherwise as in subjective above    Objective: BP (!) 152/100   Pulse 95   Ht 6' (1.829 m)   Wt 282 lb 3.2 oz (128 kg)   SpO2 95%   BMI 38.27 kg/m   Wt Readings from Last 3 Encounters:  03/06/21 282 lb 3.2 oz (128 kg)  07/24/20 280 lb (127 kg)  05/03/19 284 lb (128.8 kg)   BP Readings from Last 3 Encounters:  03/06/21 (!) 152/100  07/24/20 (!) 138/94  05/03/19 140/90    General appearance: alert, no distress, well developed, well nourished Neck: supple, no lymphadenopathy, no thyromegaly, no masses, no bruits Heart: RRR, normal S1, S2, no murmurs Lungs: CTA  bilaterally, no wheezes, rhonchi, or rales Pulses: 2+ radial pulses, 2+ pedal pulses, normal cap refill Ext: no edema     Assessment: Encounter Diagnoses  Name Primary?  . Essential hypertension Yes  . Microalbuminuria   . Elevated PSA   . Benign prostatic hyperplasia without lower urinary tract symptoms   . Dyslipidemia   . Vaccine counseling      Plan: We discussed that we need to have accurate phone numbers on file.  He will update this with our front office today.  We will call to get a copy of multiple vaccines he had at [facility] last year  We discussed the importance of better blood pressure control given the findings today and microalbuminuria.  Continue lisinopril HCT but add Toprol-XL.  Referral to cardiology for other testing which will likely include echocardiogram and CT calcium score  Dyslipidemia, low HDL-continue statin, work on healthy lifestyle, healthy diet  Elevated PSA, BPH- repeat labs today.  We discussed that we could not get a hold of him in October to discuss.  Referral to urology    Charles Moore was seen today for medication management.  Diagnoses and all orders for this visit:  Essential hypertension -     Ambulatory referral to Cardiology  Microalbuminuria  Elevated PSA -  PSA, total and free -     Ambulatory referral to Urology  Benign prostatic hyperplasia without lower urinary tract symptoms -     Ambulatory referral to Urology  Dyslipidemia  Vaccine counseling  Other orders -     rosuvastatin (CRESTOR) 20 MG tablet; Take 1 tablet (20 mg total) by mouth daily. -     lisinopril-hydrochlorothiazide (ZESTORETIC) 20-25 MG tablet; Take 1 tablet by mouth daily. -     aspirin EC 81 MG tablet; Take 1 tablet (81 mg total) by mouth daily. -     metoprolol succinate (TOPROL XL) 50 MG 24 hr tablet; Take 1 tablet (50 mg total) by mouth daily. Take with or immediately following a meal.    Follow up: pending labs

## 2021-03-07 LAB — PSA, TOTAL AND FREE
PSA, Free Pct: 26.1 %
PSA, Free: 1.41 ng/mL
Prostate Specific Ag, Serum: 5.4 ng/mL — ABNORMAL HIGH (ref 0.0–4.0)

## 2021-03-19 ENCOUNTER — Telehealth: Payer: Self-pay | Admitting: Medical

## 2021-03-19 NOTE — Telephone Encounter (Signed)
Referral Coordinator has been informed of requested change.

## 2021-03-19 NOTE — Telephone Encounter (Signed)
Pt called and would like to go to the urology place in [place] instead of the one in [place] states it is closer if that can be switched for him

## 2021-05-02 ENCOUNTER — Ambulatory Visit (INDEPENDENT_AMBULATORY_CARE_PROVIDER_SITE_OTHER): Payer: 59 | Admitting: Urology

## 2021-05-02 ENCOUNTER — Encounter: Payer: Self-pay | Admitting: Urology

## 2021-05-02 ENCOUNTER — Other Ambulatory Visit: Payer: Self-pay

## 2021-05-02 VITALS — BP 184/82 | HR 92 | Temp 99.0°F | Wt 281.8 lb

## 2021-05-02 DIAGNOSIS — N400 Enlarged prostate without lower urinary tract symptoms: Secondary | ICD-10-CM | POA: Diagnosis not present

## 2021-05-02 DIAGNOSIS — R972 Elevated prostate specific antigen [PSA]: Secondary | ICD-10-CM | POA: Diagnosis not present

## 2021-05-02 LAB — MICROSCOPIC EXAMINATION
Bacteria, UA: NONE SEEN
Epithelial Cells (non renal): NONE SEEN /hpf (ref 0–10)
Renal Epithel, UA: NONE SEEN /hpf
WBC, UA: NONE SEEN /hpf (ref 0–5)

## 2021-05-02 LAB — URINALYSIS, ROUTINE W REFLEX MICROSCOPIC
Bilirubin, UA: NEGATIVE
Glucose, UA: NEGATIVE
Ketones, UA: NEGATIVE
Leukocytes,UA: NEGATIVE
Nitrite, UA: NEGATIVE
Specific Gravity, UA: 1.02 (ref 1.005–1.030)
Urobilinogen, Ur: 0.2 mg/dL (ref 0.2–1.0)
pH, UA: 6 (ref 5.0–7.5)

## 2021-05-02 LAB — BLADDER SCAN AMB NON-IMAGING: Scan Result: 0

## 2021-05-02 MED ORDER — LEVOFLOXACIN 750 MG PO TABS: 750.0000 mg | ORAL_TABLET | Freq: Once | ORAL | 0 refills | Status: AC

## 2021-05-02 NOTE — Progress Notes (Signed)
Urological Symptom Review  Patient is experiencing the following symptoms: Frequent urination Get up at night to urinate Erection problems (male only)   Review of Systems  Gastrointestinal (upper)  : Negative for upper GI symptoms  Gastrointestinal (lower) : Negative for lower GI symptoms  Constitutional : Night Sweats Fatigue  Skin: Negative for skin symptoms  Eyes: Negative for eye symptoms  Ear/Nose/Throat : Negative for Ear/Nose/Throat symptoms  Hematologic/Lymphatic: Negative for Hematologic/Lymphatic symptoms  Cardiovascular : Negative for cardiovascular symptoms  Respiratory : Cough  Endocrine: Negative for endocrine symptoms  Musculoskeletal: Negative for musculoskeletal symptoms  Neurological: Negative for neurological symptoms  Psychologic: Negative for psychiatric symptoms

## 2021-05-02 NOTE — Patient Instructions (Addendum)
Appointment Time: 12:30 Appointment Date: 06/06/2021  Location: [facility]   Prostate Biopsy Instructions  Stop all aspirin or blood thinners (aspirin, plavix, coumadin, warfarin, motrin, ibuprofen, advil, aleve, naproxen, naprosyn) for 7 days prior to the procedure.  If you have any questions about stopping these medications, please contact your primary care physician or cardiologist.  Having a light meal prior to the procedure is recommended.  If you are diabetic or have low blood sugar please bring a small snack or glucose tablet.  A Fleets enema is needed to be purchased over the counter at a local pharmacy and used 2 hours before you scheduled appointment.  This can be purchased over the counter at any pharmacy.  Antibiotics will be administered in the clinic at the time of the procedure and 1 tablet has been sent to your pharmacy. Please take the antibiotic as prescribed.    Please bring someone with you to the procedure to drive you home if you are given a valium to take prior to your procedure.   If you have any questions or concerns, please feel free to call the office at [phone] or send a Mychart message.    Thank you, [facility]     [facility] (11th ed., pp. [phone]). Elsevier.">  Transrectal Ultrasound-Guided Prostate Biopsy A transrectal ultrasound-guided prostate biopsy is a procedure to remove samples of prostate tissue for testing. The procedure uses ultrasound images to guide the process of removing the samples. The samples are taken to a lab to bechecked for prostate cancer. This procedure is usually done to evaluate the prostate gland of men who have raised (elevated) levels of prostate-specific antigen (PSA), which can be a sign of prostatecancer. Tell a health care provider about: Any allergies you have. All medicines you are taking, including vitamins, herbs, eye drops, creams, and over-the-counter  medicines. Any problems you or family members have had with anesthetic medicines. Any blood disorders you have. Any surgeries you have had. Any medical conditions you have. What are the risks? Generally, this is a safe procedure. However, problems may occur, including: Prostate infection. Bleeding from the rectum. Blood in the urine. Allergic reactions to medicines. Damage to surrounding structures such as blood vessels, organs, or muscles. Difficulty passing urine. Nerve damage. This is usually temporary. Pain. What happens before the procedure? Eating and drinking restrictions Follow instructions from your health care provider about eating and drinking. In most instances, you will not need to stop eating and drinking completelybefore the procedure. Medicines Ask your health care provider about: Changing or stopping your regular medicines. This is especially important if you are taking diabetes medicines or blood thinners. Taking medicines such as aspirin and ibuprofen. These medicines can thin your blood. Do not take these medicines unless your health care provider tells you to take them. Taking over-the-counter medicines, vitamins, herbs, and supplements. General instructions You will be given an enema. During an enema, a liquid is injected into your rectum to clear out waste. You may have a blood or urine sample taken. Plan to have a responsible adult take you home from the hospital or clinic. If you will be going home right after the procedure, plan to have a responsible adult care for you for the time you are told. This is important. Ask your health care provider what steps will be taken to help prevent infection. These steps may include: Washing skin with a germ-killing soap. Taking antibiotic medicine. What happens during the procedure?  You will be given one or both of the following: A medicine to help you relax (sedative). A medicine to numb the area (local  anesthetic). You will be placed on your left side, and your knees may be bent. A probe with lubricated gel will be placed into your rectum, and images will be taken of your prostate and surrounding structures. Numbing medicine will be injected into your prostate. A biopsy needle will be inserted through your rectum and guided to your prostate using the ultrasound images. Prostate tissue samples will be removed, and the needle will then be removed. The biopsy samples will be sent to a lab to be tested. The procedure may vary among health care providers and hospitals. What happens after the procedure? Your blood pressure, heart rate, breathing rate, and blood oxygen level will be monitored until the medicines you were given have worn off. You may have some discomfort in the rectal area. You will be given pain medicine as needed. Do not drive for 24 hours if you received a sedative. Summary A transrectal ultrasound-guided biopsy removes samples of tissue from your prostate. This procedure is usually done to evaluate the prostate gland of men who have raised (elevated) levels of prostate-specific antigen (PSA), which can be a sign of prostate cancer. After your procedure, you may feel some discomfort in the rectal area. Plan to have a responsible adult take you home from the hospital or clinic. This information is not intended to replace advice given to you by your health care provider. Make sure you discuss any questions you have with your healthcare provider. Document Revised: 07/14/2020 Document Reviewed: 06/15/2020 Elsevier Patient Education  2022 [employer].

## 2021-05-02 NOTE — Progress Notes (Signed)
05/02/2021 9:45 AM   Charles Moore May 08, 1956 [id number]  Referring provider: Carlena Hurl, PA-C [address] [place],  [place] [address]  Chief Complaint  Patient presents with   New Patient (Initial Visit)    Elevated PSA    HPI: Mr Charles Moore is a 65yo here for evaluation of elevated PSA and BPH. PSA was 6.2 in 07/2020, 5.4 in 02/2021. IPSS 9 QOL 3. He has a brother diagnosed with prostate cancer at age 60 who was treated with IMRT and ADT. No other complaints today.    PMH: Past Medical History:  Diagnosis Date   Allergy    seasonal with rag weed and pollen    Asthma    rare wheezing   Hypertension 2010   Kidney stone    Nut allergy    Obesity    Wears glasses     Surgical History: Past Surgical History:  Procedure Laterality Date   LITHOTRIPSY      Home Medications:  Allergies as of 05/02/2021       Reactions   Other Anaphylaxis   Almonds, cashews, [place] nuts        Medication List        Accurate as of May 02, 2021  9:45 AM. If you have any questions, ask your nurse or doctor.          aspirin EC 81 MG tablet Take 1 tablet (81 mg total) by mouth daily.   lisinopril-hydrochlorothiazide 20-25 MG tablet Commonly known as: ZESTORETIC Take 1 tablet by mouth daily.   metoprolol succinate 50 MG 24 hr tablet Commonly known as: Toprol XL Take 1 tablet (50 mg total) by mouth daily. Take with or immediately following a meal.   rosuvastatin 20 MG tablet Commonly known as: CRESTOR Take 1 tablet (20 mg total) by mouth daily.        Allergies:  Allergies  Allergen Reactions   Other Anaphylaxis    Almonds, cashews, [place] nuts    Family History: Family History  Problem Relation Age of Onset   Other Mother        pneumonia   Cancer Father        lung   Cancer Brother        lung   Alcohol abuse Brother    Heart disease Brother 65       CABG   Hypertension Brother    Colon polyps Brother    Colon cancer Paternal  Grandfather    Esophageal cancer Neg Hx    Rectal cancer Neg Hx    Stomach cancer Neg Hx     Social History:  reports that he has never smoked. He has never used smokeless tobacco. He reports current alcohol use. He reports that he does not use drugs.  ROS: All other review of systems were reviewed and are negative except what is noted above in HPI  Physical Exam: BP (!) 184/82   Pulse 92   Temp 99 F (37.2 C) (Oral)   Wt 281 lb 12.8 oz (127.8 kg)   BMI 38.22 kg/m   Constitutional:  Alert and oriented, No acute distress. HEENT:  AT, moist mucus membranes.  Trachea midline, no masses. Cardiovascular: No clubbing, cyanosis, or edema. Respiratory: Normal respiratory effort, no increased work of breathing. GI: Abdomen is soft, nontender, nondistended, no abdominal masses GU: No CVA tenderness. Circumcised phallus. No masses/lesions on penis, testis, scrotum. Prostate 40g smooth no nodules no induration.  Lymph: No cervical or inguinal lymphadenopathy.  Skin: No rashes, bruises or suspicious lesions. Neurologic: Grossly intact, no focal deficits, moving all 4 extremities. Psychiatric: Normal mood and affect.  Laboratory Data: Lab Results  Component Value Date   WBC 9.1 07/24/2020   HGB 15.1 07/24/2020   HCT 46.2 07/24/2020   MCV 94 07/24/2020   PLT 233 07/24/2020    Lab Results  Component Value Date   CREATININE 1.07 07/24/2020    Lab Results  Component Value Date   PSA 4.66 (H) 09/18/2015   PSA 5.34 (H) 08/29/2015    No results found for: TESTOSTERONE  Lab Results  Component Value Date   HGBA1C 5.3 07/24/2020    Urinalysis    Component Value Date/Time   COLORURINE YELLOW 10/20/2015 0946   APPEARANCEUR CLEAR 10/20/2015 0946   LABSPEC 1.015 10/20/2015 0946   PHURINE 6.5 10/20/2015 0946   GLUCOSEU NEGATIVE 10/20/2015 0946   HGBUR SMALL (A) 10/20/2015 0946   BILIRUBINUR NEGATIVE 10/20/2015 0946   BILIRUBINUR 1+ 09/18/2015 1707   KETONESUR NEGATIVE  10/20/2015 0946   PROTEINUR 100 (A) 10/20/2015 0946   UROBILINOGEN negative 09/18/2015 1707   NITRITE NEGATIVE 10/20/2015 0946   LEUKOCYTESUR NEGATIVE 10/20/2015 0946    Lab Results  Component Value Date   LABMICR 128.1 07/24/2020   BACTERIA FEW (A) 10/20/2015    Pertinent Imaging:  Results for orders placed during the hospital encounter of 01/09/09  DG Abd 1 View  Narrative Clinical Data: Pre lithotripsy, right UPJ stone.  ABDOMEN - 1 VIEW  Comparison: CT abdomen pelvis 01/02/2009  Findings: A 7 mm stone is seen in the expected location of the right ureteral pelvic junction.  No additional radiopaque calculi project over the renal outlines or expected course of the ureters bilaterally.  Phleboliths are seen in the anatomic pelvis.  IMPRESSION: Right ureteral pelvic junction stone.  Provider: Mickie Kay, Janene Madeira  No results found for this or any previous visit.  No results found for this or any previous visit.  No results found for this or any previous visit.  No results found for this or any previous visit.  No results found for this or any previous visit.  No results found for this or any previous visit.  No results found for this or any previous visit.   Assessment & Plan:    1. Benign prostatic hyperplasia without lower urinary tract symptoms Patient defers therapy at this time - BLADDER SCAN AMB NON-IMAGING - Urinalysis, Routine w reflex microscopic  2. Elevated PSA The patient and I talked about etiologies of elevated PSA.  We discussed the possible relationship between elevated PSA, prostate cancer, BPH, prostatitis, and UTI.   Conservative treatment of elevated PSA with watchful waiting was discussed with the patient.  All questions were answered.        All of the risks and benefits along with alternatives to prostate biopsy were discussed with the patient.  The patient gave fully informed consent to proceed with a transrectal  ultrasound guided biopsy of the prostate for the evaluation of their evated PSA.  Prostate biopsy instructions and antibiotics were given to the patient.    No follow-ups on file.  Nicolette Bang, MD  [facility] [place]

## 2021-05-15 NOTE — Progress Notes (Signed)
Cardiology Office Note:   Date:  05/16/2021  NAME:  Charles Moore    MRN: [id number] DOB:  02/13/1956   PCP:  Carlena Hurl, PA-C  Cardiologist:  None  Electrophysiologist:  None   Referring MD: Carlena Hurl, PA-C   Chief Complaint  Patient presents with   Abnormal ECG    History of Present Illness:   Charles Moore is a 65 y.o. male with a hx of HTN, HLD, asthma who is being seen today for the evaluation of abnormal EKG at the request of Carlena Hurl, PA-C.  He reports he is relatively healthy.  Has a history of hypertension.  BP well controlled today.  He is on lisinopril-HCTZ.  Also on metoprolol.  BP 127/62.  He reports he has never had a heart attack or stroke.  He is a never smoker.  He rarely drinks alcohol.  No drug use is reported.  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  He reports he is married.  Has 1 son.  No grandchildren yet.  He reports he is working on his diet.  He is working on salt reduction strategies.  He reports he exercises several days per week.  Walks up to 1 mile on the treadmill.  He can get occasional shortness of breath but symptoms are very few and far between.  All of his numbers seem to be at goal.  He is without complaints.  EKG today demonstrates changes consistent with LVH.  No murmurs on examination.  Cardiovascular examination is normal.  No family history of heart disease.  He really is low risk for heart disease.  He takes aspirin.  This is unnecessary.  Overall doing well without any complaints today in the office.  Problem List HTN HLD -T chol 93, HDL 37, LDL 42, TG 61  Past Medical History: Past Medical History:  Diagnosis Date   Allergy    seasonal with rag weed and pollen    Asthma    rare wheezing   Hypertension 2010   Kidney stone    Nut allergy    Obesity    Wears glasses     Past Surgical History: Past Surgical History:  Procedure Laterality Date   LITHOTRIPSY      Current  Medications: Current Meds  Medication Sig   lisinopril-hydrochlorothiazide (ZESTORETIC) 20-25 MG tablet Take 1 tablet by mouth daily.   metoprolol succinate (TOPROL XL) 50 MG 24 hr tablet Take 1 tablet (50 mg total) by mouth daily. Take with or immediately following a meal.   rosuvastatin (CRESTOR) 20 MG tablet Take 1 tablet (20 mg total) by mouth daily.   [DISCONTINUED] aspirin EC 81 MG tablet Take 1 tablet (81 mg total) by mouth daily.     Allergies:    Other   Social History: Social History   Socioeconomic History   Marital status: Married    Spouse name: Not on file   Number of children: 1   Years of education: Not on file   Highest education level: Not on file  Occupational History   Occupation: Retired Librarian, academic [employer]  Tobacco Use   Smoking status: Never   Smokeless tobacco: Never  Substance and Sexual Activity   Alcohol use: Yes    Alcohol/week: 0.0 standard drinks    Comment: 1 beer a month   Drug use: No   Sexual activity: Not on file  Other Topics Concern   Not on file  Social History Narrative   Married, 1 child, 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  Exercise - with walking.   He notes being a prior fast food junky but mostly eats wife's cooking now.   Social Determinants of Health   Financial Resource Strain: Not on file  Food Insecurity: Not on file  Transportation Needs: Not on file  Physical Activity: Not on file  Stress: Not on file  Social Connections: Not on file     Family History: The patient's family history includes Alcohol abuse in his brother; Cancer in his brother and father; Colon cancer in his paternal grandfather; Colon polyps in his brother; Heart disease (age of onset: 50) in his brother; Hypertension in his brother; Other in his mother. There is no history of Esophageal cancer, Rectal cancer, or Stomach cancer.  ROS:   All other ROS reviewed and negative. Pertinent positives noted in the HPI.     EKGs/Labs/Other  Studies Reviewed:   The following studies were personally reviewed by me today:  EKG:  EKG is ordered today.  The ekg ordered today demonstrates normal sinus rhythm, left anterior fascicular block, LVH by voltage, and was personally reviewed by me.   Recent Labs: 07/24/2020: ALT 42; BUN 10; Creatinine, Ser 1.07; Hemoglobin 15.1; Platelets 233; Potassium 3.6; Sodium 142; TSH 2.500   Recent Lipid Panel    Component Value Date/Time   CHOL 93 (L) 07/24/2020 1332   TRIG 61 07/24/2020 1332   HDL 37 (L) 07/24/2020 1332   CHOLHDL 2.5 07/24/2020 1332   CHOLHDL 3.5 12/31/2016 1003   VLDL 20 12/31/2016 1003   LDLCALC 42 07/24/2020 1332    Physical Exam:   VS:  BP 127/62   Pulse 89   Ht '6\' 1"'$  (1.854 m)   Wt 279 lb (126.6 kg)   SpO2 98%   BMI 36.81 kg/m    Wt Readings from Last 3 Encounters:  05/16/21 279 lb (126.6 kg)  05/02/21 281 lb 12.8 oz (127.8 kg)  03/06/21 282 lb 3.2 oz (128 kg)    General: Well nourished, well developed, in no acute distress Head: Atraumatic, normal size  Eyes: PEERLA, EOMI  Neck: Supple, no JVD Endocrine: No thryomegaly Cardiac: Normal S1, S2; RRR; no murmurs, rubs, or gallops Lungs: Clear to auscultation bilaterally, no wheezing, rhonchi or rales  Abd: Soft, nontender, no hepatomegaly  Ext: No edema, pulses 2+ Musculoskeletal: No deformities, BUE and BLE strength normal and equal Skin: Warm and dry, no rashes   Neuro: Alert and oriented to person, place, time, and situation, CNII-XII grossly intact, no focal deficits  Psych: Normal mood and affect   ASSESSMENT:   Charles Moore is a 65 y.o. male who presents for the following: 1. Nonspecific abnormal electrocardiogram (ECG) (EKG)   2. LVH (left ventricular hypertrophy)   3. Primary hypertension   4. Mixed hyperlipidemia   5. Obesity (BMI 30-39.9)     PLAN:   1. Nonspecific abnormal electrocardiogram (ECG) (EKG) 2. LVH (left ventricular hypertrophy) -History of hypertension.  EKG shows LVH  changes.  Cardiovascular examination is normal.  We will set him up for an echocardiogram.  I suspect this will be normal.  His BP is well controlled.  I recommended to continue with good blood pressure control as well as aggressive salt reductive strategies.  Overall seems to be doing well.  No symptoms.  3. Primary hypertension -BP well controlled.  No change in medications.  4. Mixed hyperlipidemia -Most recent LDL  cholesterol 42.  Would recommend to continue Crestor 20 mg daily.  No need for aspirin.  He has never had a heart attack or stroke.  Calcium scoring would not change his management.  His LDL cholesterol is phenomenal.  We will continue his Crestor.  5. Obesity (BMI 30-39.9) -Diet and exercise recommended.  Disposition: Return if symptoms worsen or fail to improve.  Medication Adjustments/Labs and Tests Ordered: Current medicines are reviewed at length with the patient today.  Concerns regarding medicines are outlined above.  Orders Placed This Encounter  Procedures   EKG 12-Lead   ECHOCARDIOGRAM COMPLETE    No orders of the defined types were placed in this encounter.   Patient Instructions  Medication Instructions:   STOP ASPIRIN  *If you need a refill on your cardiac medications before your next appointment, please call your pharmacy*   Testing/Procedures:  Your physician has requested that you have an echocardiogram. Echocardiography is a painless test that uses sound waves to create images of your heart. It provides your doctor with information about the size and shape of your heart and how well your heart's chambers and valves are working. This procedure takes approximately one hour. There are no restrictions for this procedure. [place]   Follow-Up: At [facility], you and your health needs are our priority.  As part of our continuing mission to provide you with exceptional heart care, we have created designated Provider Care Teams.  These  Care Teams include your primary Cardiologist (physician) and Advanced Practice Providers (APPs -  Physician Assistants and Nurse Practitioners) who all work together to provide you with the care you need, when you need it.  We recommend signing up for the patient portal called "MyChart".  Sign up information is provided on this After Visit Summary.  MyChart is used to connect with patients for Virtual Visits (Telemedicine).  Patients are able to view lab/test results, encounter notes, upcoming appointments, etc.  Non-urgent messages can be sent to your provider as well.   To learn more about what you can do with MyChart, go to [email or web address].    Your next appointment:    AS NEEDED   Signed, [place] T. Audie Box, MD, [place]  [address] [place], [place] [address] [phone]  05/16/2021 8:38 AM

## 2021-05-16 ENCOUNTER — Ambulatory Visit (INDEPENDENT_AMBULATORY_CARE_PROVIDER_SITE_OTHER): Payer: 59 | Admitting: Cardiovascular Disease

## 2021-05-16 ENCOUNTER — Other Ambulatory Visit: Payer: Self-pay

## 2021-05-16 ENCOUNTER — Encounter: Payer: Self-pay | Admitting: Cardiovascular Disease

## 2021-05-16 VITALS — BP 127/62 | HR 89 | Ht 73.0 in | Wt 279.0 lb

## 2021-05-16 DIAGNOSIS — I517 Cardiomegaly: Secondary | ICD-10-CM

## 2021-05-16 DIAGNOSIS — I10 Essential (primary) hypertension: Secondary | ICD-10-CM

## 2021-05-16 DIAGNOSIS — E669 Obesity, unspecified: Secondary | ICD-10-CM

## 2021-05-16 DIAGNOSIS — E782 Mixed hyperlipidemia: Secondary | ICD-10-CM | POA: Diagnosis not present

## 2021-05-16 DIAGNOSIS — R9431 Abnormal electrocardiogram [ECG] [EKG]: Secondary | ICD-10-CM

## 2021-05-16 NOTE — Patient Instructions (Signed)
Medication Instructions:   STOP ASPIRIN  *If you need a refill on your cardiac medications before your next appointment, please call your pharmacy*   Testing/Procedures:  Your physician has requested that you have an echocardiogram. Echocardiography is a painless test that uses sound waves to create images of your heart. It provides your doctor with information about the size and shape of your heart and how well your heart's chambers and valves are working. This procedure takes approximately one hour. There are no restrictions for this procedure. [place]   Follow-Up: At [facility], you and your health needs are our priority.  As part of our continuing mission to provide you with exceptional heart care, we have created designated Provider Care Teams.  These Care Teams include your primary Cardiologist (physician) and Advanced Practice Providers (APPs -  Physician Assistants and Nurse Practitioners) who all work together to provide you with the care you need, when you need it.  We recommend signing up for the patient portal called "MyChart".  Sign up information is provided on this After Visit Summary.  MyChart is used to connect with patients for Virtual Visits (Telemedicine).  Patients are able to view lab/test results, encounter notes, upcoming appointments, etc.  Non-urgent messages can be sent to your provider as well.   To learn more about what you can do with MyChart, go to [email or web address].    Your next appointment:    AS NEEDED

## 2021-06-06 ENCOUNTER — Other Ambulatory Visit: Payer: Self-pay | Admitting: Urology

## 2021-06-06 ENCOUNTER — Ambulatory Visit (HOSPITAL_COMMUNITY)
Admission: RE | Admit: 2021-06-06 | Discharge: 2021-06-06 | Disposition: A | Discharge status: Home / Self Care | Payer: 59 | Source: Ambulatory Visit | Attending: Urology | Admitting: Urology

## 2021-06-06 ENCOUNTER — Encounter (HOSPITAL_COMMUNITY): Payer: Self-pay

## 2021-06-06 ENCOUNTER — Encounter: Payer: Self-pay | Admitting: Urology

## 2021-06-06 ENCOUNTER — Other Ambulatory Visit: Payer: Self-pay

## 2021-06-06 ENCOUNTER — Ambulatory Visit (INDEPENDENT_AMBULATORY_CARE_PROVIDER_SITE_OTHER): Payer: 59 | Admitting: Urology

## 2021-06-06 DIAGNOSIS — R972 Elevated prostate specific antigen [PSA]: Secondary | ICD-10-CM | POA: Insufficient documentation

## 2021-06-06 MED ORDER — GENTAMICIN SULFATE 40 MG/ML IJ SOLN: 80.0000 mg | Freq: Once | INTRAMUSCULAR | Status: AC

## 2021-06-06 MED ORDER — LIDOCAINE HCL (PF) 2 % IJ SOLN
INTRAMUSCULAR | Status: AC
  Administered 2021-06-06: 10 mL
  Filled 2021-06-06: qty 10

## 2021-06-06 MED ORDER — GENTAMICIN SULFATE 40 MG/ML IJ SOLN
INTRAMUSCULAR | Status: AC
  Administered 2021-06-06: 80 mg via INTRAMUSCULAR
  Filled 2021-06-06: qty 2

## 2021-06-06 MED ORDER — LIDOCAINE HCL (PF) 2 % IJ SOLN: 10.0000 mL | Freq: Once | INTRAMUSCULAR | Status: AC

## 2021-06-06 NOTE — Patient Instructions (Signed)
Transrectal Ultrasound-Guided Prostate Biopsy, Care After This sheet gives you information about how to care for yourself after your procedure. Your doctor may also give you more specific instructions. If youhave problems or questions, contact your doctor. What can I expect after the procedure? After the procedure, it is common to have: Pain and discomfort in your butt, especially while sitting. Pink-colored pee (urine), due to small amounts of blood in the pee. Burning while peeing (urinating). Blood in your poop (stool). Bleeding from your butt. Blood in your semen. Follow these instructions at home: Medicines Take over-the-counter and prescription medicines only as told by your doctor. If you were prescribed antibiotic medicine, take it as told by your doctor. Do not stop taking the antibiotic even if you start to feel better. Activity  Do not drive for 24 hours if you were given a medicine to help you relax (sedative) during your procedure. Return to your normal activities as told by your doctor. Ask your doctor what activities are safe for you. Ask your doctor when it is okay for you to have sex. Do not lift anything that is heavier than 10 lb (4.5 kg), or the limit that you are told, until your doctor says that it is safe.  General instructions  Drink enough water to keep your pee pale yellow. Watch your pee, poop, and semen for new bleeding or bleeding that gets worse. Keep all follow-up visits as told by your doctor. This is important.  Contact a doctor if you: Have blood clots in your pee or poop. Notice that your pee smells bad or unusual. Have very bad belly pain. Have trouble peeing. Notice that your lower belly feels firm. Have blood in your pee for more than 2 weeks after the procedure. Have blood in your semen for more than 2 months after the procedure. Have problems getting an erection. Feel sick to your stomach (nauseous) or throw up (vomit). Have new or worse  bleeding in your pee, poop, or semen. Get help right away if you: Have a fever or chills. Have bright red pee. Have very bad pain that does not get better with medicine. Cannot pee. Summary After this procedure, it is common to have pain and discomfort around your butt, especially while sitting. You may have blood in your pee and poop. It is common to have blood in your semen for 1-2 months. If you were prescribed antibiotic medicine, take it as told by your doctor. Do not stop taking the antibiotic even if you start to feel better. Get help right away if you have a fever or chills. This information is not intended to replace advice given to you by your health care provider. Make sure you discuss any questions you have with your healthcare provider. Document Revised: 08/14/2020 Document Reviewed: 06/15/2020 Elsevier Patient Education  2022 Elsevier Inc.  

## 2021-06-06 NOTE — Progress Notes (Signed)
Prostate Biopsy Procedure   Informed consent was obtained after discussing risks/benefits of the procedure.  A time out was performed to ensure correct patient identity.  Pre-Procedure: - Last PSA Level:  Lab Results  Component Value Date   PSA 4.66 (H) 09/18/2015   PSA 5.34 (H) 08/29/2015   - Gentamicin given prophylactically - Levaquin 500 mg administered PO -Transrectal Ultrasound performed revealing a 118.5 gm prostate -No significant hypoechoic or median lobe noted  Procedure: - Prostate block performed using 10 cc 1% lidocaine and biopsies taken from sextant areas, a total of 12 under ultrasound guidance.  Post-Procedure: - Patient tolerated the procedure well - He was counseled to seek immediate medical attention if experiences any severe pain, significant bleeding, or fevers - Return in one week to discuss biopsy results

## 2021-06-06 NOTE — Sedation Documentation (Signed)
PT tolerated prostate biopsy procedure and antibiotic injection well today. Labs obtained and sent for pathology. PT ambulatory at discharge with no acute distress noted and verbalized understanding of discharge instructions. PT to follow up with urologist as scheduled on 06/13/21.

## 2021-06-07 ENCOUNTER — Ambulatory Visit (HOSPITAL_COMMUNITY): Payer: 59 | Attending: Cardiovascular Disease

## 2021-06-07 DIAGNOSIS — R9431 Abnormal electrocardiogram [ECG] [EKG]: Secondary | ICD-10-CM | POA: Diagnosis present

## 2021-06-07 LAB — ECHOCARDIOGRAM COMPLETE
Area-P 1/2: 2.96 cm2
S' Lateral: 3.2 cm

## 2021-06-07 MED ORDER — PERFLUTREN LIPID MICROSPHERE
1.0000 mL | INTRAVENOUS | Status: AC | PRN
  Administered 2021-06-07: 2 mL via INTRAVENOUS

## 2021-06-08 ENCOUNTER — Other Ambulatory Visit: Payer: Self-pay | Admitting: Urology

## 2021-06-13 ENCOUNTER — Other Ambulatory Visit: Payer: Self-pay

## 2021-06-13 ENCOUNTER — Ambulatory Visit (INDEPENDENT_AMBULATORY_CARE_PROVIDER_SITE_OTHER): Payer: 59 | Admitting: Urology

## 2021-06-13 ENCOUNTER — Encounter: Payer: Self-pay | Admitting: Urology

## 2021-06-13 VITALS — BP 124/71 | HR 80

## 2021-06-13 DIAGNOSIS — C61 Malignant neoplasm of prostate: Secondary | ICD-10-CM

## 2021-06-13 NOTE — Patient Instructions (Signed)
Prostate Cancer °The prostate is a small gland that helps make semen. It is located below a man's bladder, in front of the rectum. Prostate cancer is when abnormal cells grow in this gland. °What are the causes? °The cause of this condition is not known. °What increases the risk? °Being age 65 or older. °Having a family history of prostate cancer. °Having a family history of cancer of the breasts or ovaries. °Having genes that are passed from parent to child (inherited). °Having Lynch syndrome. °African American men and men of African descent are diagnosed with prostate cancer at higher rates than other men. °What are the signs or symptoms? °Problems peeing (urinating). This may include: °A stream that is weak, or pee that stops and starts. °Trouble starting or stopping your pee. °Trouble emptying all of your pee. °Needing to pee more often, especially at night. °Blood in your pee or semen. °Pain in the: °Lower back. °Lower belly (abdomen). °Hips. °Trouble getting an erection. °Weakness or numbness in the legs or feet. °How is this treated? °Treatment for this condition depends on: °How much the cancer has spread. °Your age. °The kind of treatment you want. °Your health. °Treatments include: °Being watched. This is called observation. You will be tested from time to time, but you will not get treated. Tests are to make sure that the cancer is not growing. °Surgery. This may be done to: °Take out (remove) the prostate. °Freeze and kill cancer cells. °Radiation. This uses a strong beam of energy to kill cancer cells. °Chemotherapy. This uses medicines that stop cancer cells from increasing. This kills cancer cells and healthy cells. °Targeted therapy. This kills cancer cells only. Healthy cells are not affected. °Hormone treatment. This stops the body from making hormones that help the cancer cells grow. °Follow these instructions at home: °Lifestyle °Do not smoke or use any products that contain nicotine or tobacco.  If you need help quitting, ask your doctor. °Eat a healthy diet. °Treatment may affect your ability to have sex. If you have a partner, touch, hold, hug, and caress your partner to have intimate moments. °Get plenty of sleep. °Ask your doctor for help to find a support group for men with prostate cancer. °General instructions °Take over-the-counter and prescription medicines only as told by your doctor. °If you have to go to the hospital, let your cancer doctor (oncologist) know. °Keep all follow-up visits. °Where to find more information °American Cancer Society: www.cancer.org °American Society of Clinical Oncology: www.cancer.net °National Cancer Institute: www.cancer.gov °Contact a doctor if: °You have new or more trouble peeing. °You have new or more blood in your pee. °You have new or more pain in your hips, back, or chest. °Get help right away if: °You have weakness in your legs. °You lose feeling in your legs. °You cannot control your pee or your poop (stool). °You have chills or a fever. °Summary °The prostate is a male gland that helps make semen. °Prostate cancer is when abnormal cells grow in this gland. °Treatment includes doing surgery, using medicines, using strong beams of energy, or watching without treatment. °Ask your doctor for help to find a support group for men with prostate cancer. °Contact a doctor if you have problems peeing or have any new pain that you did not have before. °This information is not intended to replace advice given to you by your health care provider. Make sure you discuss any questions you have with your health care provider. °Document Revised: 12/27/2020 Document Reviewed: 12/27/2020 °Elsevier   Patient Education © 2022 Elsevier Inc. ° °

## 2021-06-13 NOTE — Progress Notes (Signed)
06/13/2021 4:19 PM   [name] [birth year] [id number]  Referring provider: Carlena Hurl, PA-C [address] [place],  [place] [address]  Followup prostate biopsy   HPI: Mr Segovia is a 65yo here for followup after prostate biopsy. PSA 5.4. Biopsy revealed Gleason 3+3=6 in 2/12 cores 40-60% of core. Prostate volume 118cc. He has mild LUTS on no BPH therapy   PMH: Past Medical History:  Diagnosis Date   Allergy    seasonal with rag weed and pollen    Asthma    rare wheezing   Hypertension 2010   Kidney stone    Nut allergy    Obesity    Wears glasses     Surgical History: Past Surgical History:  Procedure Laterality Date   LITHOTRIPSY      Home Medications:  Allergies as of 06/13/2021       Reactions   Other Anaphylaxis   Almonds, cashews, [place] nuts        Medication List        Accurate as of June 13, 2021  4:19 PM. If you have any questions, ask your nurse or doctor.          lisinopril-hydrochlorothiazide 20-25 MG tablet Commonly known as: ZESTORETIC Take 1 tablet by mouth daily.   metoprolol succinate 50 MG 24 hr tablet Commonly known as: Toprol XL Take 1 tablet (50 mg total) by mouth daily. Take with or immediately following a meal.   rosuvastatin 20 MG tablet Commonly known as: CRESTOR Take 1 tablet (20 mg total) by mouth daily.        Allergies:  Allergies  Allergen Reactions   Other Anaphylaxis    Almonds, cashews, [place] nuts    Family History: Family History  Problem Relation Age of Onset   Other Mother        pneumonia   Cancer Father        lung   Cancer Brother        lung   Alcohol abuse Brother    Heart disease Brother 78       CABG   Hypertension Brother    Colon polyps Brother    Colon cancer Paternal Grandfather    Esophageal cancer Neg Hx    Rectal cancer Neg Hx    Stomach cancer Neg Hx     Social History:  reports that he has never smoked. He has never used smokeless tobacco. He  reports current alcohol use. He reports that he does not use drugs.  ROS: All other review of systems were reviewed and are negative except what is noted above in HPI  Physical Exam: BP 124/71   Pulse 80   Constitutional:  Alert and oriented, No acute distress. HEENT: [place] AT, moist mucus membranes.  Trachea midline, no masses. Cardiovascular: No clubbing, cyanosis, or edema. Respiratory: Normal respiratory effort, no increased work of breathing. GI: Abdomen is soft, nontender, nondistended, no abdominal masses GU: No CVA tenderness.  Lymph: No cervical or inguinal lymphadenopathy. Skin: No rashes, bruises or suspicious lesions. Neurologic: Grossly intact, no focal deficits, moving all 4 extremities. Psychiatric: Normal mood and affect.  Laboratory Data: Lab Results  Component Value Date   WBC 9.1 07/24/2020   HGB 15.1 07/24/2020   HCT 46.2 07/24/2020   MCV 94 07/24/2020   PLT 233 07/24/2020    Lab Results  Component Value Date   CREATININE 1.07 07/24/2020    Lab Results  Component Value Date   PSA 4.66 (  H) 09/18/2015   PSA 5.34 (H) 08/29/2015    No results found for: TESTOSTERONE  Lab Results  Component Value Date   HGBA1C 5.3 07/24/2020    Urinalysis    Component Value Date/Time   COLORURINE YELLOW 10/20/2015 0946   APPEARANCEUR Clear 05/02/2021 0911   LABSPEC 1.015 10/20/2015 0946   PHURINE 6.5 10/20/2015 0946   GLUCOSEU Negative 05/02/2021 0911   HGBUR SMALL (A) 10/20/2015 0946   BILIRUBINUR Negative 05/02/2021 0911   KETONESUR NEGATIVE 10/20/2015 0946   PROTEINUR 1+ (A) 05/02/2021 0911   PROTEINUR 100 (A) 10/20/2015 0946   UROBILINOGEN negative 09/18/2015 1707   NITRITE Negative 05/02/2021 0911   NITRITE NEGATIVE 10/20/2015 0946   LEUKOCYTESUR Negative 05/02/2021 0911    Lab Results  Component Value Date   LABMICR See below: 05/02/2021   WBCUA None seen 05/02/2021   LABEPIT None seen 05/02/2021   MUCUS Present 05/02/2021   BACTERIA None seen  05/02/2021    Pertinent Imaging:  Results for orders placed during the hospital encounter of 01/09/09  DG Abd 1 View  Narrative Clinical Data: Pre lithotripsy, right UPJ stone.  ABDOMEN - 1 VIEW  Comparison: CT abdomen pelvis 01/02/2009  Findings: A 7 mm stone is seen in the expected location of the right ureteral pelvic junction.  No additional radiopaque calculi project over the renal outlines or expected course of the ureters bilaterally.  Phleboliths are seen in the anatomic pelvis.  IMPRESSION: Right ureteral pelvic junction stone.  Provider: Mickie Kay, Janene Madeira  No results found for this or any previous visit.  No results found for this or any previous visit.  No results found for this or any previous visit.  No results found for this or any previous visit.  No results found for this or any previous visit.  No results found for this or any previous visit.  No results found for this or any previous visit.   Assessment & Plan:    1. Elevated PSA I discussed the natural history of low risk prostate cancer with the patient and the various treatment options including active surveillance, RALP, IMRT, brachytherapy, cryotherapy, HIFU and ADT. I will have him see Dr. Tresa Moore for consideration of RALP and Dr. Tammi Klippel for consideration of IMRT   No follow-ups on file.  Nicolette Bang, MD  [facility] [place]

## 2021-06-15 ENCOUNTER — Telehealth: Payer: Self-pay | Admitting: *Deleted

## 2021-06-15 NOTE — Telephone Encounter (Signed)
Returned patient's phone call, spoke with patient 

## 2021-06-15 NOTE — Telephone Encounter (Signed)
xxxxx 

## 2021-06-25 NOTE — Progress Notes (Signed)
GU Location of Tumor / Histology:  Prostate Ca  If Prostate Cancer, Gleason Score is (3 + 3) and PSA is (5.4)   Biopsies:      Past/Anticipated interventions by urology, if any:    Dr. Alyson Ingles:   Discussed the natural history of low risk prostate cancer with the patient and the various treatment options including active surveillance, RALP, IMRT, brachytherapy, cryotherapy, HIFU and ADT. I will have him see Dr. Tresa Moore for consideration of RALP and Dr. Tammi Klippel for consideration of IMRT   Past/Anticipated interventions by medical oncology, if any:     Weight changes, if any: Patient states loss 7lbs.  IPSS:  14 SHIM:  17  Bowel/Bladder complaints, if any:  No bowel changes, No bladder issues to report at this time.  Nausea/Vomiting, if any:  No  Pain issues, if any:  3/10  SAFETY ISSUES: Prior radiation?  No Pacemaker/ICD?  No Possible current pregnancy? Male Is the patient on methotrexate?  No  Current Complaints / other details:

## 2021-06-26 ENCOUNTER — Ambulatory Visit
Admission: RE | Admit: 2021-06-26 | Discharge: 2021-06-26 | Disposition: A | Discharge status: Home / Self Care | Payer: 59 | Source: Ambulatory Visit | Attending: Radiation Oncology | Admitting: Radiation Oncology

## 2021-06-26 ENCOUNTER — Other Ambulatory Visit: Payer: Self-pay

## 2021-06-26 ENCOUNTER — Encounter: Payer: Self-pay | Admitting: Licensed Clinical Social Worker

## 2021-06-26 ENCOUNTER — Encounter: Payer: Self-pay | Admitting: Radiation Oncology

## 2021-06-26 VITALS — BP 125/63 | HR 89 | Temp 97.8°F | Resp 20 | Ht 69.0 in | Wt 273.0 lb

## 2021-06-26 DIAGNOSIS — Z87442 Personal history of urinary calculi: Secondary | ICD-10-CM | POA: Insufficient documentation

## 2021-06-26 DIAGNOSIS — E669 Obesity, unspecified: Secondary | ICD-10-CM | POA: Insufficient documentation

## 2021-06-26 DIAGNOSIS — E785 Hyperlipidemia, unspecified: Secondary | ICD-10-CM | POA: Diagnosis not present

## 2021-06-26 DIAGNOSIS — I10 Essential (primary) hypertension: Secondary | ICD-10-CM | POA: Diagnosis not present

## 2021-06-26 DIAGNOSIS — Z79899 Other long term (current) drug therapy: Secondary | ICD-10-CM | POA: Diagnosis not present

## 2021-06-26 DIAGNOSIS — J45909 Unspecified asthma, uncomplicated: Secondary | ICD-10-CM | POA: Diagnosis not present

## 2021-06-26 DIAGNOSIS — C61 Malignant neoplasm of prostate: Secondary | ICD-10-CM | POA: Insufficient documentation

## 2021-06-26 DIAGNOSIS — Z800 Family history of malignant neoplasm of digestive organs: Secondary | ICD-10-CM | POA: Diagnosis not present

## 2021-06-26 DIAGNOSIS — Z801 Family history of malignant neoplasm of trachea, bronchus and lung: Secondary | ICD-10-CM | POA: Diagnosis not present

## 2021-06-26 NOTE — Progress Notes (Signed)
Radiation Oncology         [phone] ________________________________  Initial Outpatient Consultation  Name: Charles Moore MRN: [id number]  Date: 06/26/2021  DOB: 1956-05-11  CC:Tysinger, Camelia Eng, PA-C  McKenzie, Candee Furbish, MD   REFERRING PHYSICIAN: Cleon Gustin, MD  DIAGNOSIS: 65 y.o. gentleman with Stage T1c adenocarcinoma of the prostate with Gleason score of 3+3, and PSA of 5.4.    ICD-10-CM   1. Malignant neoplasm of prostate (Charles Moore)  [facility] referral to Social Work      HISTORY OF PRESENT ILLNESS: Charles Moore is a 65 y.o. male with a diagnosis of prostate cancer. He was initially noted to have a  elevated PSA of 6.2 in 07/2020 and remained elevated at 5.4 on follow up in 02/2021 with his primary care provider, Chana Bode, PA-C.  Accordingly, he was referred for evaluation in urology by Dr. Alyson Ingles on 05/02/21,  digital rectal examination was performed at that time revealing no nodules.  The patient proceeded to transrectal ultrasound with 12 biopsies of the prostate on 06/06/21.  The prostate volume measured 118.5 cc.  Out of 12 core biopsies, 2 were positive.  The maximum Gleason score was 3+3, and this was seen in the left apex lateral (two foci) and left apex.  Of note, his brother, Charles Moore, was diagnosed with prostate cancer at age 85 and treated with ADT + 5.5 weeks of prostate IMRT and has done very well.  The patient reviewed the biopsy results with his urologist and he has kindly been referred today for discussion of potential radiation treatment options.   PREVIOUS RADIATION THERAPY: No  PAST MEDICAL HISTORY:  Past Medical History:  Diagnosis Date   Allergy    seasonal with rag weed and pollen    Asthma    rare wheezing   Hypertension 2010   Kidney stone    Nut allergy    Obesity    Wears glasses       PAST SURGICAL HISTORY: Past Surgical History:  Procedure Laterality Date   LITHOTRIPSY      FAMILY HISTORY:  Family History   Problem Relation Age of Onset   Other Mother        pneumonia   Cancer Father        lung   Cancer Brother        lung   Alcohol abuse Brother    Heart disease Brother 41       CABG   Hypertension Brother    Colon polyps Brother    Colon cancer Paternal Grandfather    Esophageal cancer Neg Hx    Rectal cancer Neg Hx    Stomach cancer Neg Hx     SOCIAL HISTORY:  Social History   Socioeconomic History   Marital status: Married    Spouse name: Not on file   Number of children: 1   Years of education: Not on file   Highest education level: Not on file  Occupational History   Occupation: Retired Librarian, academic [employer]  Tobacco Use   Smoking status: Never   Smokeless tobacco: Never  Vaping Use   Vaping Use: Never used  Substance and Sexual Activity   Alcohol use: Yes    Alcohol/week: 0.0 standard drinks    Comment: 1 beer a month   Drug use: No   Sexual activity: Not on file  Other Topics Concern   Not on file  Social History Narrative   Married, 1 child, 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  Exercise - with walking.   He notes being a prior fast food junky but mostly eats wife's cooking now.   Social Determinants of Health   Financial Resource Strain: Not on file  Food Insecurity: Not on file  Transportation Needs: Not on file  Physical Activity: Not on file  Stress: Not on file  Social Connections: Not on file  Intimate Partner Violence: Not on file    ALLERGIES: Other  MEDICATIONS:  Current Outpatient Medications  Medication Sig Dispense Refill   lisinopril-hydrochlorothiazide (ZESTORETIC) 20-25 MG tablet Take 1 tablet by mouth daily. 90 tablet 3   metoprolol succinate (TOPROL XL) 50 MG 24 hr tablet Take 1 tablet (50 mg total) by mouth daily. Take with or immediately following a meal. 90 tablet 3   rosuvastatin (CRESTOR) 20 MG tablet Take 1 tablet (20 mg total) by mouth daily. 90 tablet 3   No current facility-administered medications for  this encounter.    REVIEW OF SYSTEMS:  On review of systems, the patient reports that he is doing well overall. He denies any chest pain, shortness of breath, cough, fevers, chills, night sweats, unintended weight changes. He denies any bowel disturbances, and denies abdominal pain, nausea or vomiting. He denies any new musculoskeletal or joint aches or pains. His IPSS was 14, indicating moderate urinary symptoms. His SHIM was 17, indicating he has mild erectile dysfunction. A complete review of systems is obtained and is otherwise negative.   PHYSICAL EXAM:  Wt Readings from Last 3 Encounters:  06/26/21 273 lb (123.8 kg)  05/16/21 279 lb (126.6 kg)  05/02/21 281 lb 12.8 oz (127.8 kg)   Temp Readings from Last 3 Encounters:  06/26/21 97.8 F (36.6 C)  06/06/21 98.3 F (36.8 C) (Oral)  05/02/21 99 F (37.2 C) (Oral)   BP Readings from Last 3 Encounters:  06/26/21 125/63  06/13/21 124/71  06/07/21 (!) 128/93   Pulse Readings from Last 3 Encounters:  06/26/21 89  06/13/21 80  06/06/21 90   Pain Assessment Pain Score: 3  Pain Loc: Back (Lower back and left knee)/10  In general this is a well appearing African American male in no acute distress. He's alert and oriented x4 and appropriate throughout the examination. Cardiopulmonary assessment is negative for acute distress, and he exhibits normal effort.     KPS = 100  100 - Normal; no complaints; no evidence of disease. 90   - Able to carry on normal activity; minor signs or symptoms of disease. 80   - Normal activity with effort; some signs or symptoms of disease. 30   - Cares for self; unable to carry on normal activity or to do active work. 60   - Requires occasional assistance, but is able to care for most of his personal needs. 50   - Requires considerable assistance and frequent medical care. 76   - Disabled; requires special care and assistance. 69   - Severely disabled; hospital admission is indicated although death  not imminent. 43   - Very sick; hospital admission necessary; active supportive treatment necessary. 10   - Moribund; fatal processes progressing rapidly. 0     - Dead  Karnofsky DA, Abelmann WH, Craver LS and Burchenal [facility] [phone]) The use of the nitrogen mustards in the palliative treatment of carcinoma: with particular reference to bronchogenic carcinoma Cancer 1 634-56  LABORATORY DATA:  Lab Results  Component Value Date   WBC 9.1 07/24/2020   HGB  15.1 07/24/2020   HCT 46.2 07/24/2020   MCV 94 07/24/2020   PLT 233 07/24/2020   Lab Results  Component Value Date   NA 142 07/24/2020   K 3.6 07/24/2020   CL 103 07/24/2020   CO2 24 07/24/2020   Lab Results  Component Value Date   ALT 42 07/24/2020   AST 34 07/24/2020   ALKPHOS 76 07/24/2020   BILITOT 0.7 07/24/2020     RADIOGRAPHY: [place] Transrectal Complete  Result Date: 06/20/2021 CLINICAL DATA:  Prostate biopsy. EXAM: IR ULTRASOUND GUIDED ASPIRATION/DRAINAGE TECHNIQUE: Ultrasound guidance was provided for prostate biopsy. COMPARISON:  None. FINDINGS: See above IMPRESSION: Ultrasound guidance provided for prostate biopsy. Electronically Signed   By: Aletta Edouard M.D.   On: 06/06/2021 16:45   US Guided Needle Placement  Result Date: 06/20/2021 CLINICAL DATA:  Prostate biopsy. EXAM: IR ULTRASOUND GUIDED ASPIRATION/DRAINAGE TECHNIQUE: Ultrasound guidance was provided for prostate biopsy. COMPARISON:  None. FINDINGS: See above IMPRESSION: Ultrasound guidance provided for prostate biopsy. Electronically Signed   By: Aletta Edouard M.D.   On: 06/06/2021 16:45   ECHOCARDIOGRAM COMPLETE  Result Date: 06/07/2021    ECHOCARDIOGRAM REPORT   Patient Name:   KEMAURY QUANCE Sanzo Date of Exam: 06/07/2021 Medical Rec #:  [id number]       Height:       73.0 in Accession #:    [id number]      Weight:       279.0 lb Date of Birth:  [birth year]       BSA:          2.479 m Patient Age:    19 years        BP:           128/93 mmHg Patient Gender: M                HR:           87 bpm. Exam Location:  [facility] Procedure: 2D Echo, Cardiac Doppler, Color Doppler and Intracardiac            Opacification Agent Indications:    R94.31 Abnormal ECG  History:        Patient has no prior history of Echocardiogram examinations.                 Risk Factors:Hypertension and HLD.  Sonographer:    Marygrace Drought RCS Referring Phys: [id number] [place]  1. Left ventricular ejection fraction, by estimation, is 65 to 70%. The left ventricle has normal function. The left ventricle has no regional wall motion abnormalities. Left ventricular diastolic parameters are indeterminate.  2. Right ventricular systolic function is normal. The right ventricular size is normal.  3. The mitral valve is normal in structure. No evidence of mitral valve regurgitation.  4. The aortic valve is normal in structure. Aortic valve regurgitation is not visualized. No aortic stenosis is present.  5. Aortic dilatation noted. There is mild dilatation of the ascending aorta, measuring 40 mm. FINDINGS  Left Ventricle: Left ventricular ejection fraction, by estimation, is 65 to 70%. The left ventricle has normal function. The left ventricle has no regional wall motion abnormalities. The left ventricular internal cavity size was normal in size. There is  no left ventricular hypertrophy. Left ventricular diastolic parameters are indeterminate. Right Ventricle: The right ventricular size is normal. Right vetricular wall thickness was not well visualized. Right ventricular systolic function is normal. Left Atrium: Left atrial size was normal in size. Right  Atrium: Right atrial size was normal in size. Pericardium: There is no evidence of pericardial effusion. Mitral Valve: The mitral valve is normal in structure. No evidence of mitral valve regurgitation. Tricuspid Valve: The tricuspid valve is normal in structure. Tricuspid valve regurgitation is trivial. Aortic Valve: The aortic valve is  normal in structure. Aortic valve regurgitation is not visualized. No aortic stenosis is present. Pulmonic Valve: The pulmonic valve was not well visualized. Pulmonic valve regurgitation is not visualized. Aorta: Aortic dilatation noted. There is mild dilatation of the ascending aorta, measuring 40 mm. IAS/Shunts: The atrial septum is grossly normal.  LEFT VENTRICLE PLAX 2D LVIDd:         4.40 cm  Diastology LVIDs:         3.20 cm  LV e' medial:    5.11 cm/s LV PW:         1.30 cm  LV E/e' medial:  14.3 LV IVS:        1.40 cm  LV e' lateral:   4.57 cm/s LVOT diam:     2.30 cm  LV E/e' lateral: 16.0 LV SV:         79 LV SV Index:   32 LVOT Area:     4.15 cm  RIGHT VENTRICLE RV Basal diam:  3.10 cm RV S prime:     20.10 cm/s LEFT ATRIUM             Index       RIGHT ATRIUM           Index LA diam:        3.30 cm 1.33 cm/m  RA Area:     15.00 cm LA Vol (A2C):   49.7 ml 20.05 ml/m RA Volume:   42.80 ml  17.27 ml/m LA Vol (A4C):   45.1 ml 18.20 ml/m LA Biplane Vol: 48.4 ml 19.53 ml/m  AORTIC VALVE LVOT Vmax:   94.60 cm/s LVOT Vmean:  64.600 cm/s LVOT VTI:    0.190 m  AORTA Ao Root diam: 3.90 cm Ao Asc diam:  4.00 cm MITRAL VALVE MV Area (PHT):              SHUNTS MV Decel Time:              Systemic VTI:  0.19 m MV E velocity: 73.30 cm/s   Systemic Diam: 2.30 cm MV A velocity: 101.00 cm/s MV E/A ratio:  0.73 Mertie Moores MD Electronically signed by Mertie Moores MD Signature Date/Time: 06/07/2021/12:47:01 PM    Final    [place] PROSTATE BIOPSY MULTIPLE  Result Date: 06/20/2021 CLINICAL DATA:  Prostate biopsy. EXAM: IR ULTRASOUND GUIDED ASPIRATION/DRAINAGE TECHNIQUE: Ultrasound guidance was provided for prostate biopsy. COMPARISON:  None. FINDINGS: See above IMPRESSION: Ultrasound guidance provided for prostate biopsy. Electronically Signed   By: Aletta Edouard M.D.   On: 06/06/2021 16:45      IMPRESSION/PLAN: 1. 65 y.o. gentleman with Stage T1c adenocarcinoma of the prostate with Gleason Score of 3+3, and PSA of  5.4. We discussed the patient's workup and outlined the nature of prostate cancer in this setting. The patient's T stage, Gleason's score, and PSA put him into the favorable low risk group. Accordingly, he is eligible for a variety of potential treatment options including active surveillance, brachytherapy, 5.5 weeks of external radiation, or prostatectomy. We discussed the guideline recommendation for active surveillance and reviewed that this would likely entail PSA testing every 3 to 6 months, periodic prostate imaging with MRI as  well as serial prostate biopsies every 1 to 2 years for monitoring.  We discussed the available radiation techniques, and focused on the details and logistics of delivery. The patient is not a candidate for brachytherapy with a prostate volume of 118 cc. We discussed and outlined the risks, benefits, short and long-term effects associated with radiotherapy and compared and contrasted these with prostatectomy. We discussed the role of SpaceOAR gel in reducing the rectal toxicity associated with radiotherapy.  He appears to have a good understanding of his disease and our treatment recommendations which are of curative intent.  He was encouraged to ask questions that were answered to his stated satisfaction.  At the conclusion of our conversation, appears to be most interested in proceeding with active surveillance but plans to keep his scheduled consult visit with Dr. Tresa Moore on 07/30/2021 to discuss his surgical options prior to finalizing his decision and will also meet back with Dr. Alyson Ingles on 07/20/2021.  We enjoyed meeting him and his wife today and look forward to continuing to follow along in his care.  We will share our discussion with Dr. Alyson Ingles.  Of course, we would be more than happy to continue to participate in his care if he ultimately elects to proceed with radiotherapy now or in the future.  We personally spent 70 minutes in this encounter including chart review,  reviewing radiological studies, meeting face-to-face with the patient, entering orders and completing documentation.    Nicholos Johns, PA-C    Tyler Pita, MD  [facility] Direct Dial: [phone]  Fax: [phone] [email or web address]  Skype  [email or web address]   This document serves as a record of services personally performed by Tyler Pita, MD and Freeman Caldron, PA-C. It was created on their behalf by Wilburn Mylar, a trained medical scribe. The creation of this record is based on the scribe's personal observations and the provider's statements to them. This document has been checked and approved by the attending provider.

## 2021-06-26 NOTE — Progress Notes (Signed)
[place] Psychosocial Distress Screening Clinical Social Work  Clinical Social Work was referred by distress screening protocol.  The patient scored a 5 on the Psychosocial Distress Thermometer which indicates moderate distress. Clinical Social Worker  attempted to contact pt by phone  to assess for distress and other psychosocial needs.  No answer, left VM with brief explanation of support services should pt decide to pursue treatment at [facility].  ONCBCN DISTRESS SCREENING 06/26/2021  Screening Type Initial Screening  Distress experienced in past week (1-10) 5  Emotional problem type Nervousness/Anxiety    Clinical Social Worker follow up needed: No.  If yes, follow up plan:  Yuriy Cui E Lluvia Gwynne, LCSW

## 2021-07-04 ENCOUNTER — Encounter: Payer: Self-pay | Admitting: Medical

## 2021-07-04 ENCOUNTER — Ambulatory Visit (INDEPENDENT_AMBULATORY_CARE_PROVIDER_SITE_OTHER): Payer: 59 | Admitting: Medical

## 2021-07-04 ENCOUNTER — Other Ambulatory Visit: Payer: Self-pay

## 2021-07-04 VITALS — BP 138/88 | HR 79 | Ht 69.0 in | Wt 273.0 lb

## 2021-07-04 DIAGNOSIS — Z7185 Encounter for immunization safety counseling: Secondary | ICD-10-CM | POA: Diagnosis not present

## 2021-07-04 DIAGNOSIS — E669 Obesity, unspecified: Secondary | ICD-10-CM

## 2021-07-04 DIAGNOSIS — C61 Malignant neoplasm of prostate: Secondary | ICD-10-CM | POA: Diagnosis not present

## 2021-07-04 DIAGNOSIS — E785 Hyperlipidemia, unspecified: Secondary | ICD-10-CM

## 2021-07-04 DIAGNOSIS — Z0000 Encounter for general adult medical examination without abnormal findings: Secondary | ICD-10-CM

## 2021-07-04 DIAGNOSIS — Z23 Encounter for immunization: Secondary | ICD-10-CM

## 2021-07-04 DIAGNOSIS — R809 Proteinuria, unspecified: Secondary | ICD-10-CM | POA: Diagnosis not present

## 2021-07-04 DIAGNOSIS — D126 Benign neoplasm of colon, unspecified: Secondary | ICD-10-CM | POA: Insufficient documentation

## 2021-07-04 DIAGNOSIS — I10 Essential (primary) hypertension: Secondary | ICD-10-CM

## 2021-07-04 DIAGNOSIS — Z1211 Encounter for screening for malignant neoplasm of colon: Secondary | ICD-10-CM | POA: Insufficient documentation

## 2021-07-04 DIAGNOSIS — Z87892 Personal history of anaphylaxis: Secondary | ICD-10-CM

## 2021-07-04 NOTE — Progress Notes (Signed)
Complete physical exam   Patient: Charles Moore   DOB: 1956/05/01   65 y.o. Male  MRN: [id number] Visit Date: 07/04/2021  Chief Complaint  Patient presents with   Annual Exam    Care Team Dr. Joni Fears, ortho Dr. [place] Cellar, GI Dr. Nicolette Bang, urology Dr. Tyler Pita, radiation oncology Dr. Eleonore Chiquito, cardiology Veronia Laprise, Camelia Eng, PA-C here for primary care   Concerns: Since last visit here he recently got diagnosed with prostate cancer.  He had prostate biopsies. right now they felt like he did not need treatment.  They are using a watch and wait approach.  He is compliant with his medication. No other c/o    Past Medical History:  Diagnosis Date   Allergy    seasonal with rag weed and pollen    Asthma    rare wheezing   Hypertension 2010   Kidney stone    Nut allergy    Obesity    Wears glasses    Past Surgical History:  Procedure Laterality Date   LITHOTRIPSY      Family Status  Relation Name Status   Mother  Deceased   Father  Deceased   Brother percy Deceased   Brother seth [place]   Brother OC Alive   Sister patricia Alive   Brother steve Alive   Brother melvin Alive   PGF  (Not Specified)   Neg Hx  (Not Specified)   Family History  Problem Relation Age of Onset   Other Mother        pneumonia   Cancer Father        lung   Cancer Brother        lung   Alcohol abuse Brother    Heart disease Brother 64       CABG   Hypertension Brother    Colon polyps Brother    Colon cancer Paternal Grandfather    Esophageal cancer Neg Hx    Rectal cancer Neg Hx    Stomach cancer Neg Hx    Allergies  Allergen Reactions   Other Anaphylaxis    Almonds, cashews, [place] nuts    Patient Care Team: Malyna Budney, Camelia Eng, PA-C as PCP - General (Family Medicine)   Medications: Outpatient Medications Prior to Visit  Medication Sig   lisinopril-hydrochlorothiazide (ZESTORETIC) 20-25 MG tablet Take 1 tablet by mouth  daily.   metoprolol succinate (TOPROL XL) 50 MG 24 hr tablet Take 1 tablet (50 mg total) by mouth daily. Take with or immediately following a meal.   rosuvastatin (CRESTOR) 20 MG tablet Take 1 tablet (20 mg total) by mouth daily.   No facility-administered medications prior to visit.    Review of Systems Review of Systems Constitutional: -fever, -chills, -sweats, -unexpected weight change, -anorexia, -fatigue Allergy: -sneezing, -itching, -congestion Dermatology: denies changing moles, rash, lumps, new worrisome lesions ENT: -runny nose, -ear pain, -sore throat, -hoarseness, -sinus pain, -teeth pain, -tinnitus, -hearing loss, -epistaxis Cardiology:  -chest pain, -palpitations, -edema, -orthopnea, -paroxysmal nocturnal dyspnea Respiratory: -cough, -shortness of breath, -dyspnea on exertion, -wheezing, -hemoptysis Gastroenterology: -abdominal pain, -nausea, -vomiting, -diarrhea, -constipation, -blood in stool, -changes in bowel movement, -dysphagia Hematology: -bleeding or bruising problems Musculoskeletal: -arthralgias, -myalgias, -joint swelling, -back pain, -neck pain, -cramping, -gait changes Ophthalmology: -vision changes, -eye redness, -itching, -discharge Urology: -dysuria, -difficulty urinating, -hematuria, -urinary frequency, -urgency, incontinence Neurology: -headache, -weakness, -tingling, -numbness, -speech abnormality, -memory loss, -falls, -dizziness Psychology:  -depressed mood, -agitation, -sleep problems  Objective    BP 138/88   Pulse 79   Ht 5\' 9"  (1.753 m)   Wt 273 lb (123.8 kg)   BMI 40.32 kg/m   BP Readings from Last 3 Encounters:  07/04/21 138/88  06/26/21 125/63  06/13/21 124/71   Wt Readings from Last 3 Encounters:  07/04/21 273 lb (123.8 kg)  06/26/21 273 lb (123.8 kg)  05/16/21 279 lb (126.6 kg)     General appearence: alert, no distress, WD/WN, African American male HEENT: normocephalic, sclerae anicteric, PERRLA, EOMi Neck: supple, no  lymphadenopathy, no thyromegaly, no masses, no bruits Heart: RRR, normal S1, S2, no murmurs Lungs: CTA bilaterally, no wheezes, rhonchi, or rales Abdomen: +bs, soft, non tender, non distended, no masses, no hepatomegaly, no splenomegaly Back: non tender Musculoskeletal: nontender, no swelling, no obvious deformity Extremities: no edema, no cyanosis, no clubbing Pulses: 2+ symmetric, upper and lower extremities, normal cap refill Neurological: alert, oriented x 3, CN2-12 intact, strength normal upper extremities and lower extremities, sensation normal throughout, DTRs 2+ throughout, no cerebellar signs, gait normal Psychiatric: normal affect, behavior normal, pleasant  GU/rectal - deferred to urology    Last depression screening scores PHQ 2/9 Scores 07/04/2021 03/06/2021 07/24/2020  PHQ - 2 Score 0 0 0   Last fall risk screening Fall Risk  07/04/2021  Falls in the past year? 1  Number falls in past yr: 1  Comment fell in wet grass and installing lightbulb  Injury with Fall? 0  Risk for fall due to : History of fall(s)  Follow up Falls evaluation completed      Assessment & Plan   Encounter Diagnoses  Name Primary?   Routine general medical examination at a health care facility Yes   Vaccine counseling    Malignant neoplasm of prostate ([place])    Microalbuminuria    Obesity without serious comorbidity, unspecified classification, unspecified obesity type    Dyslipidemia    Essential hypertension    History of anaphylaxis    Screen for colon cancer    Adenomatous polyp of colon, unspecified part of colon    Needs flu shot    Need for Td vaccine          This visit was a preventative care visit, also known as wellness visit or routine physical.   Topics typically include healthy lifestyle, diet, exercise, preventative care, vaccinations, sick and well care, proper use of emergency dept and after hours care, as well as other concerns.     Recommendations: Continue to  return yearly for your annual wellness and preventative care visits.  This gives [place] a chance to discuss healthy lifestyle, exercise, vaccinations, review your chart record, and perform screenings where appropriate.  I recommend you see your eye doctor yearly for routine vision care.  I recommend you see your dentist yearly for routine dental care including hygiene visits twice yearly.   Vaccination recommendations were reviewed Immunization History  Administered Date(s) Administered   DTaP 04/07/1958, 05/26/1958   Hepatitis A 08/08/2019, 01/11/2020   Hepatitis B 08/08/2019, 01/02/2020, 05/10/2020   Influenza,inj,Quad PF,6+ Mos 07/24/2020, 07/04/2021   Influenza-Unspecified 07/03/2015   Moderna Sars-Covid-2 Vaccination 03/11/2020   Td 07/04/2021   Tdap 08/15/2011   Zoster Recombinat (Shingrix) 08/08/2019, 01/02/2020    Counseled on the influenza virus vaccine.  Vaccine information sheet given.  Influenza vaccine given after consent obtained.  Counseled on the Td (tetanus, diptheria) vaccine.  Vaccine information sheet given. Td vaccine given after consent obtained.    Screening for  cancer: Colon cancer screening: Past due.  Referral back to GI  Skin cancer screening: Check your skin regularly for new changes, growing lesions, or other lesions of concern Come in for evaluation if you have skin lesions of concern.  Lung cancer screening: If you have a greater than 20 pack year history of tobacco use, then you may qualify for lung cancer screening with a chest CT scan.   Please call your insurance company to inquire about coverage for this test.  We currently don't have screenings for other cancers besides breast, cervical, colon, and lung cancers.  If you have a strong family history of cancer or have other cancer screening concerns, please let me know.    Bone health: Get at least 150 minutes of aerobic exercise weekly Get weight bearing exercise at least once weekly Bone  density test:  A bone density test is an imaging test that uses a type of X-ray to measure the amount of calcium and other minerals in your bones. The test may be used to diagnose or screen you for a condition that causes weak or thin bones (osteoporosis), predict your risk for a broken bone (fracture), or determine how well your osteoporosis treatment is working. The bone density test is recommended for females 41 and older, or females or males <40 if certain risk factors such as thyroid disease, long term use of steroids such as for asthma or rheumatological issues, vitamin D deficiency, estrogen deficiency, family history of osteoporosis, self or family history of fragility fracture in first degree relative.    Heart health: Get at least 150 minutes of aerobic exercise weekly Limit alcohol It is important to maintain a healthy blood pressure and healthy cholesterol numbers  Heart disease screening: Screening for heart disease includes screening for blood pressure, fasting lipids, glucose/diabetes screening, BMI height to weight ratio, reviewed of smoking status, physical activity, and diet.    Goals include blood pressure 120/80 or less, maintaining a healthy lipid/cholesterol profile, preventing diabetes or keeping diabetes numbers under good control, not smoking or using tobacco products, exercising most days per week or at least 150 minutes per week of exercise, and eating healthy variety of fruits and vegetables, healthy oils, and avoiding unhealthy food choices like fried food, fast food, high sugar and high cholesterol foods.     I reviewed recent 05/2021 echocardiogram:  IMPRESSIONS Left ventricular ejection fraction, by estimation, is 65 to 70%. The left ventricle has normal function. The left ventricle has no regional wall motion abnormalities. Left ventricular diastolic parameters are indeterminate. 1. 2. Right ventricular systolic function is normal. The right ventricular size  is normal. 3. The mitral valve is normal in structure. No evidence of mitral valve regurgitation. The aortic valve is normal in structure. Aortic valve regurgitation is not visualized. No aortic stenosis is present. 4. Aortic dilatation noted. There is mild dilatation of the ascending aorta, measuring 40 mm.   Medical care options: I recommend you continue to seek care here first for routine care.  We try really hard to have available appointments Monday through Friday daytime hours for sick visits, acute visits, and physicals.  Urgent care should be used for after hours and weekends for significant issues that cannot wait till the next day.  The emergency department should be used for significant potentially life-threatening emergencies.  The emergency department is expensive, can often have long wait times for less significant concerns, so try to utilize primary care, urgent care, or telemedicine when possible to avoid  unnecessary trips to the emergency department.  Virtual visits and telemedicine have been introduced since the pandemic started in 2020, and can be convenient ways to receive medical care.  We offer virtual appointments as well to assist you in a variety of options to seek medical care.    Separate significant issues discussed: Recent prostate cancer diagnosis-he will continue routine follow-up with urology.  Currently they have decided to use a watch and wait approach  Hypertension-continue current medication, advised on monitoring, discussed goal blood pressures  Hyperlipidemia-continue current medication, routine labs today  Microalbuminuria likely due to abnormal blood pressures in the past.  Continue to work blood pressure control  Obesity-discussed need to lose weight through healthy diet and exercise  Colon polyp-follow-up with gastroenterology for updated screening   Butch was seen today for annual exam.  Diagnoses and all orders for this visit:  Routine  general medical examination at a health care facility -     Comprehensive metabolic panel -     CBC with Differential/Platelet -     Lipid panel -     TSH -     Hemoglobin A1c -     Ambulatory referral to Gastroenterology  Vaccine counseling  Malignant neoplasm of prostate ([place])  Microalbuminuria  Obesity without serious comorbidity, unspecified classification, unspecified obesity type  Dyslipidemia  Essential hypertension  History of anaphylaxis  Screen for colon cancer -     Ambulatory referral to Gastroenterology  Adenomatous polyp of colon, unspecified part of colon -     Ambulatory referral to Gastroenterology  Needs flu shot -     Flu Vaccine QUAD 31mo+IM (Fluarix, Fluzone & Alfiuria Quad PF)  Need for Td vaccine -     Td vaccine greater than or equal to 7yo preservative free IM   Follow-up pending labs, yearly for physical

## 2021-07-05 ENCOUNTER — Other Ambulatory Visit: Payer: Self-pay | Admitting: Internal Medicine

## 2021-07-05 ENCOUNTER — Other Ambulatory Visit: Payer: Self-pay | Admitting: Medical

## 2021-07-05 DIAGNOSIS — E669 Obesity, unspecified: Secondary | ICD-10-CM

## 2021-07-05 LAB — COMPREHENSIVE METABOLIC PANEL
ALT: 27 IU/L (ref 0–44)
AST: 27 IU/L (ref 0–40)
Albumin/Globulin Ratio: 1.5 (ref 1.2–2.2)
Albumin: 4.5 g/dL (ref 3.8–4.8)
Alkaline Phosphatase: 66 IU/L (ref 44–121)
BUN/Creatinine Ratio: 10 (ref 10–24)
BUN: 12 mg/dL (ref 8–27)
Bilirubin Total: 1 mg/dL (ref 0.0–1.2)
CO2: 24 mmol/L (ref 20–29)
Calcium: 10.1 mg/dL (ref 8.6–10.2)
Chloride: 101 mmol/L (ref 96–106)
Creatinine, Ser: 1.23 mg/dL (ref 0.76–1.27)
Globulin, Total: 3 g/dL (ref 1.5–4.5)
Glucose: 106 mg/dL — ABNORMAL HIGH (ref 65–99)
Potassium: 3.6 mmol/L (ref 3.5–5.2)
Sodium: 140 mmol/L (ref 134–144)
Total Protein: 7.5 g/dL (ref 6.0–8.5)
eGFR: 66 mL/min/{1.73_m2} (ref >59)

## 2021-07-05 LAB — CBC WITH DIFFERENTIAL/PLATELET
Basophils Absolute: 0.1 10*3/uL (ref 0.0–0.2)
Basos: 1 %
EOS (ABSOLUTE): 0.3 10*3/uL (ref 0.0–0.4)
Eos: 3 %
Hematocrit: 42.5 % (ref 37.5–51.0)
Hemoglobin: 14 g/dL (ref 13.0–17.7)
Immature Grans (Abs): 0 10*3/uL (ref 0.0–0.1)
Immature Granulocytes: 0 %
Lymphocytes Absolute: 2.5 10*3/uL (ref 0.7–3.1)
Lymphs: 32 %
MCH: 31.5 pg (ref 26.6–33.0)
MCHC: 32.9 g/dL (ref 31.5–35.7)
MCV: 96 fL (ref 79–97)
Monocytes Absolute: 0.6 10*3/uL (ref 0.1–0.9)
Monocytes: 8 %
Neutrophils Absolute: 4.3 10*3/uL (ref 1.4–7.0)
Neutrophils: 56 %
Platelets: 212 10*3/uL (ref 150–450)
RBC: 4.44 x10E6/uL (ref 4.14–5.80)
RDW: 11.2 % — ABNORMAL LOW (ref 11.6–15.4)
WBC: 7.8 10*3/uL (ref 3.4–10.8)

## 2021-07-05 LAB — TSH: TSH: 2.75 u[IU]/mL (ref 0.450–4.500)

## 2021-07-05 LAB — HEMOGLOBIN A1C
Est. average glucose Bld gHb Est-mCnc: 97 mg/dL
Hgb A1c MFr Bld: 5 % (ref 4.8–5.6)

## 2021-07-05 LAB — LIPID PANEL
Chol/HDL Ratio: 2.9 ratio (ref 0.0–5.0)
Cholesterol, Total: 114 mg/dL (ref 100–199)
HDL: 40 mg/dL (ref >39)
LDL Chol Calc (NIH): 55 mg/dL (ref 0–99)
Triglycerides: 100 mg/dL (ref 0–149)
VLDL Cholesterol Cal: 19 mg/dL (ref 5–40)

## 2021-07-18 ENCOUNTER — Telehealth: Payer: Self-pay | Admitting: Medical

## 2021-07-18 NOTE — Telephone Encounter (Signed)
Requested records received from [facility]

## 2021-07-20 ENCOUNTER — Ambulatory Visit: Payer: 59 | Admitting: Urology

## 2021-08-15 ENCOUNTER — Other Ambulatory Visit: Payer: Self-pay

## 2021-08-15 ENCOUNTER — Ambulatory Visit (INDEPENDENT_AMBULATORY_CARE_PROVIDER_SITE_OTHER): Payer: 59 | Admitting: Urology

## 2021-08-15 ENCOUNTER — Encounter: Payer: Self-pay | Admitting: Urology

## 2021-08-15 VITALS — BP 131/83 | HR 81 | Temp 98.3°F

## 2021-08-15 DIAGNOSIS — N400 Enlarged prostate without lower urinary tract symptoms: Secondary | ICD-10-CM

## 2021-08-15 DIAGNOSIS — R3912 Poor urinary stream: Secondary | ICD-10-CM

## 2021-08-15 DIAGNOSIS — C61 Malignant neoplasm of prostate: Secondary | ICD-10-CM | POA: Diagnosis not present

## 2021-08-15 DIAGNOSIS — R972 Elevated prostate specific antigen [PSA]: Secondary | ICD-10-CM | POA: Diagnosis not present

## 2021-08-15 LAB — URINALYSIS, ROUTINE W REFLEX MICROSCOPIC
Bilirubin, UA: NEGATIVE
Glucose, UA: NEGATIVE
Ketones, UA: NEGATIVE
Leukocytes,UA: NEGATIVE
Nitrite, UA: NEGATIVE
Protein,UA: NEGATIVE
Specific Gravity, UA: 1.02 (ref 1.005–1.030)
Urobilinogen, Ur: 0.2 mg/dL (ref 0.2–1.0)
pH, UA: 6 (ref 5.0–7.5)

## 2021-08-15 LAB — MICROSCOPIC EXAMINATION
Bacteria, UA: NONE SEEN
Epithelial Cells (non renal): NONE SEEN /HPF (ref 0–10)
Renal Epithel, UA: NONE SEEN /HPF
WBC, UA: NONE SEEN /HPF (ref 0–5)

## 2021-08-15 MED ORDER — LEVOFLOXACIN 750 MG PO TABS: 750.0000 mg | ORAL_TABLET | Freq: Once | ORAL | 0 refills | Status: AC

## 2021-08-15 MED ORDER — FINASTERIDE 5 MG PO TABS: 5.0000 mg | ORAL_TABLET | Freq: Every day | ORAL | 3 refills | Status: DC

## 2021-08-15 NOTE — Progress Notes (Signed)
Urological Symptom Review  Patient is experiencing the following symptoms: Frequent urination Erection problems (male only)   Review of Systems  Gastrointestinal (upper)  : Negative for upper GI symptoms  Gastrointestinal (lower) : Negative for lower GI symptoms  Constitutional : Negative for symptoms  Skin: Negative for skin symptoms  Eyes: Negative for eye symptoms  Ear/Nose/Throat : Negative for Ear/Nose/Throat symptoms  Hematologic/Lymphatic: Negative for Hematologic/Lymphatic symptoms  Cardiovascular : Negative for cardiovascular symptoms  Respiratory : Negative for respiratory symptoms  Endocrine: Negative for endocrine symptoms  Musculoskeletal: Negative for musculoskeletal symptoms  Neurological: Negative for neurological symptoms  Psychologic: Negative for psychiatric symptoms

## 2021-08-15 NOTE — Progress Notes (Signed)
08/15/2021 10:23 AM   Charles Moore [birth year] [id number]  Referring provider: Carlena Hurl, PA-C [address] [place],  [place] [address]  [place] prostate cancer   HPI: Mr Charles Moore is a 65yo here for followup for prostate cancer. He met with radiation oncology and Urology and has elected to proceed with active surveillance. He has mild LUTS on no BPH therapy. He has a weak urinary stream which bothers him. IPSS 15 QOL 3.    PMH: Past Medical History:  Diagnosis Date   Allergy    seasonal with rag weed and pollen    Asthma    rare wheezing   Hypertension 2010   Kidney stone    Nut allergy    Obesity    Wears glasses     Surgical History: Past Surgical History:  Procedure Laterality Date   LITHOTRIPSY      Home Medications:  Allergies as of 08/15/2021       Reactions   Other Anaphylaxis   Almonds, cashews, [place] nuts        Medication List        Accurate as of August 15, 2021 10:23 AM. If you have any questions, ask your nurse or doctor.          lisinopril-hydrochlorothiazide 20-25 MG tablet Commonly known as: ZESTORETIC Take 1 tablet by mouth daily.   metoprolol succinate 50 MG 24 hr tablet Commonly known as: Toprol XL Take 1 tablet (50 mg total) by mouth daily. Take with or immediately following a meal.   rosuvastatin 20 MG tablet Commonly known as: CRESTOR Take 1 tablet (20 mg total) by mouth daily.        Allergies:  Allergies  Allergen Reactions   Other Anaphylaxis    Almonds, cashews, [place] nuts    Family History: Family History  Problem Relation Age of Onset   Other Mother        pneumonia   Cancer Father        lung   Cancer Brother        lung   Alcohol abuse Brother    Heart disease Brother 22       CABG   Hypertension Brother    Colon polyps Brother    Colon cancer Paternal Grandfather    Esophageal cancer Neg Hx    Rectal cancer Neg Hx    Stomach cancer Neg Hx     Social History:  reports  that he has never smoked. He has never used smokeless tobacco. He reports current alcohol use. He reports that he does not use drugs.  ROS: All other review of systems were reviewed and are negative except what is noted above in HPI  Physical Exam: BP 131/83   Pulse 81   Temp 98.3 F (36.8 C)   Constitutional:  Alert and oriented, No acute distress. HEENT: [place] AT, moist mucus membranes.  Trachea midline, no masses. Cardiovascular: No clubbing, cyanosis, or edema. Respiratory: Normal respiratory effort, no increased work of breathing. GI: Abdomen is soft, nontender, nondistended, no abdominal masses GU: No CVA tenderness.  Lymph: No cervical or inguinal lymphadenopathy. Skin: No rashes, bruises or suspicious lesions. Neurologic: Grossly intact, no focal deficits, moving all 4 extremities. Psychiatric: Normal mood and affect.  Laboratory Data: Lab Results  Component Value Date   WBC 7.8 07/04/2021   HGB 14.0 07/04/2021   HCT 42.5 07/04/2021   MCV 96 07/04/2021   PLT 212 07/04/2021    Lab Results  Component  Value Date   CREATININE 1.23 07/04/2021    Lab Results  Component Value Date   PSA 4.66 (H) 09/18/2015   PSA 5.34 (H) 08/29/2015    No results found for: TESTOSTERONE  Lab Results  Component Value Date   HGBA1C 5.0 07/04/2021    Urinalysis    Component Value Date/Time   COLORURINE YELLOW 10/20/2015 0946   APPEARANCEUR Clear 05/02/2021 0911   LABSPEC 1.015 10/20/2015 0946   PHURINE 6.5 10/20/2015 0946   GLUCOSEU Negative 05/02/2021 0911   HGBUR SMALL (A) 10/20/2015 0946   BILIRUBINUR Negative 05/02/2021 0911   KETONESUR NEGATIVE 10/20/2015 0946   PROTEINUR 1+ (A) 05/02/2021 0911   PROTEINUR 100 (A) 10/20/2015 0946   UROBILINOGEN negative 09/18/2015 1707   NITRITE Negative 05/02/2021 0911   NITRITE NEGATIVE 10/20/2015 0946   LEUKOCYTESUR Negative 05/02/2021 0911    Lab Results  Component Value Date   LABMICR See below: 05/02/2021   WBCUA None seen  05/02/2021   LABEPIT None seen 05/02/2021   MUCUS Present 05/02/2021   BACTERIA None seen 05/02/2021    Pertinent Imaging:  Results for orders placed during the hospital encounter of 01/09/09  DG Abd 1 View  Narrative Clinical Data: Pre lithotripsy, right UPJ stone.  ABDOMEN - 1 VIEW  Comparison: CT abdomen pelvis 01/02/2009  Findings: A 7 mm stone is seen in the expected location of the right ureteral pelvic junction.  No additional radiopaque calculi project over the renal outlines or expected course of the ureters bilaterally.  Phleboliths are seen in the anatomic pelvis.  IMPRESSION: Right ureteral pelvic junction stone.  Provider: Mickie Moore, Charles Moore  No results found for this or any previous visit.  No results found for this or any previous visit.  No results found for this or any previous visit.  No results found for this or any previous visit.  No results found for this or any previous visit.  No results found for this or any previous visit.  No results found for this or any previous visit.   Assessment & Plan:    1. Prostate Cancer -Patient elects for active surveillance. RTC 3 months for prostate biopsy. We will start finasteride 61m daily due to prostate volume of 118cc.   - Urinalysis, Routine w reflex microscopic  2. BPH with weak urinary stream -We will start finasteride 568mdaily   No follow-ups on file.  PaNicolette BangMD  [facility] [place]

## 2021-08-15 NOTE — Patient Instructions (Addendum)
Appointment Time: 11/21/2020 Appointment Date: arrive 12:15  Location: [facility]   Prostate Biopsy Instructions  Stop all aspirin or blood thinners (aspirin, plavix, coumadin, warfarin, motrin, ibuprofen, advil, aleve, naproxen, naprosyn) for 7 days prior to the procedure.  If you have any questions about stopping these medications, please contact your primary care physician or cardiologist.  Having a light meal prior to the procedure is recommended.  If you are diabetic or have low blood sugar please bring a small snack or glucose tablet.  A Fleets enema is needed to be purchased over the counter at a local pharmacy and used 2 hours before you scheduled appointment.  This can be purchased over the counter at any pharmacy.  Antibiotics will be administered in the clinic at the time of the procedure and 1 tablet has been sent to your pharmacy. Please take the antibiotic as prescribed.    Please bring someone with you to the procedure to drive you home if you are given a valium to take prior to your procedure.   If you have any questions or concerns, please feel free to call the office at [phone] or send a Mychart message.    Thank you, [facility]    Transrectal Ultrasound-Guided Prostate Biopsy A transrectal ultrasound-guided prostate biopsy is a procedure to remove samples of prostate tissue for testing. The procedure uses ultrasound images to guide the process of removing the samples. The samples are taken to a lab to be checked for prostate cancer. This procedure is usually done to evaluate the prostate gland of men who have raised (elevated) levels of prostate-specific antigen (PSA), which can be a sign of prostate cancer. Tell a health care provider about: Any allergies you have. All medicines you are taking, including vitamins, herbs, eye drops, creams, and over-the-counter medicines. Any problems you or family members have had with  anesthetic medicines. Any blood disorders you have. Any surgeries you have had. Any medical conditions you have. What are the risks? Generally, this is a safe procedure. However, problems may occur, including: Prostate infection. Bleeding from the rectum. Blood in the urine. Allergic reactions to medicines. Damage to surrounding structures such as blood vessels, organs, or muscles. Difficulty passing urine. Nerve damage. This is usually temporary. Pain. What happens before the procedure? Eating and drinking restrictions Follow instructions from your health care provider about eating and drinking. In most instances, you will not need to stop eating and drinking completely before the procedure. Medicines Ask your health care provider about: Changing or stopping your regular medicines. This is especially important if you are taking diabetes medicines or blood thinners. Taking medicines such as aspirin and ibuprofen. These medicines can thin your blood. Do not take these medicines unless your health care provider tells you to take them. Taking over-the-counter medicines, vitamins, herbs, and supplements. General instructions You will be given an enema. During an enema, a liquid is injected into your rectum to clear out waste. You may have a blood or urine sample taken. Plan to have a responsible adult take you home from the hospital or clinic. If you will be going home right after the procedure, plan to have a responsible adult care for you for the time you are told. This is important. Ask your health care provider what steps will be taken to help prevent infection. These steps may include: Washing skin with a germ-killing soap. Taking antibiotic medicine. What happens during the procedure?  You will be given one  or both of the following: A medicine to help you relax (sedative). A medicine to numb the area (local anesthetic). You will be placed on your left side, and your knees may be  bent. A probe with lubricated gel will be placed into your rectum, and images will be taken of your prostate and surrounding structures. Numbing medicine will be injected into your prostate. A biopsy needle will be inserted through your rectum and guided to your prostate using the ultrasound images. Prostate tissue samples will be removed, and the needle will then be removed. The biopsy samples will be sent to a lab to be tested. The procedure may vary among health care providers and hospitals. What happens after the procedure? Your blood pressure, heart rate, breathing rate, and blood oxygen level will be monitored until the medicines you were given have worn off. You may have some discomfort in the rectal area. You will be given pain medicine as needed. Do not drive for 24 hours if you received a sedative. Summary A transrectal ultrasound-guided biopsy removes samples of tissue from your prostate. This procedure is usually done to evaluate the prostate gland of men who have raised (elevated) levels of prostate-specific antigen (PSA), which can be a sign of prostate cancer. After your procedure, you may feel some discomfort in the rectal area. Plan to have a responsible adult take you home from the hospital or clinic. This information is not intended to replace advice given to you by your health care provider. Make sure you discuss any questions you have with your health care provider. Document Revised: 07/14/2020 Document Reviewed: 06/15/2020 Elsevier Patient Education  2022 [employer].

## 2021-09-26 ENCOUNTER — Encounter: Payer: Self-pay | Admitting: Medical

## 2021-10-22 ENCOUNTER — Telehealth: Payer: Self-pay | Admitting: Urology

## 2021-10-22 NOTE — Telephone Encounter (Signed)
Patient has a biopsy on 11/21/21. LVM needing his current insurance information as his [employer] is inactive.

## 2021-10-26 ENCOUNTER — Encounter: Payer: 59 | Admitting: Gastroenterology

## 2021-11-21 ENCOUNTER — Other Ambulatory Visit: Payer: Self-pay | Admitting: Urology

## 2021-11-21 ENCOUNTER — Encounter: Payer: Self-pay | Admitting: Urology

## 2021-11-21 ENCOUNTER — Other Ambulatory Visit: Payer: Self-pay

## 2021-11-21 ENCOUNTER — Ambulatory Visit (HOSPITAL_COMMUNITY)
Admission: RE | Admit: 2021-11-21 | Discharge: 2021-11-21 | Disposition: A | Discharge status: Home / Self Care | Payer: Medicare Other | Source: Ambulatory Visit | Attending: Urology | Admitting: Urology

## 2021-11-21 ENCOUNTER — Encounter (HOSPITAL_COMMUNITY): Payer: Self-pay

## 2021-11-21 ENCOUNTER — Ambulatory Visit (HOSPITAL_BASED_OUTPATIENT_CLINIC_OR_DEPARTMENT_OTHER): Payer: Medicare Other | Admitting: Urology

## 2021-11-21 DIAGNOSIS — R972 Elevated prostate specific antigen [PSA]: Secondary | ICD-10-CM

## 2021-11-21 DIAGNOSIS — C61 Malignant neoplasm of prostate: Secondary | ICD-10-CM

## 2021-11-21 MED ORDER — LIDOCAINE HCL (PF) 2 % IJ SOLN: 10.0000 mL | Freq: Once | INTRAMUSCULAR | Status: AC

## 2021-11-21 MED ORDER — GENTAMICIN SULFATE 40 MG/ML IJ SOLN: 80.0000 mg | Freq: Once | INTRAMUSCULAR | Status: AC

## 2021-11-21 MED ORDER — LIDOCAINE HCL (PF) 2 % IJ SOLN
INTRAMUSCULAR | Status: AC
  Administered 2021-11-21: 10 mL
  Filled 2021-11-21: qty 10

## 2021-11-21 MED ORDER — GENTAMICIN SULFATE 40 MG/ML IJ SOLN
INTRAMUSCULAR | Status: AC
  Administered 2021-11-21: 80 mg via INTRAMUSCULAR
  Filled 2021-11-21: qty 2

## 2021-11-21 NOTE — Progress Notes (Signed)
PT tolerated prostate biopsy procedure and antibiotic injection well today. Labs obtained and sent for pathology. PT ambulatory at discharge with no acute distress noted and verbalized understanding of discharge instructions.  

## 2021-11-21 NOTE — Progress Notes (Signed)
Prostate Biopsy Procedure   Informed consent was obtained after discussing risks/benefits of the procedure.  A time out was performed to ensure correct patient identity.  Pre-Procedure: - Last PSA Level:  Lab Results  Component Value Date   PSA 4.66 (H) 09/18/2015   PSA 5.34 (H) 08/29/2015   - Gentamicin given prophylactically - Levaquin 500 mg administered PO -Transrectal Ultrasound performed revealing a 108.4 gm prostate -No significant hypoechoic or median lobe noted  Procedure: - Prostate block performed using 10 cc 1% lidocaine and biopsies taken from sextant areas, a total of 12 under ultrasound guidance.  Post-Procedure: - Patient tolerated the procedure well - He was counseled to seek immediate medical attention if experiences any severe pain, significant bleeding, or fevers - Return in one week to discuss biopsy results

## 2021-11-21 NOTE — Patient Instructions (Signed)
Transrectal Ultrasound-Guided Prostate Biopsy, Care After What can I expect after the procedure? After the procedure, it is common to have: Pain and discomfort near your butt (rectum), especially while sitting. Pink-colored pee (urine). This is due to small amounts of blood in your pee. A burning feeling while peeing. Blood in your poop (stool). Bleeding from your butt. Blood in your semen. Follow these instructions at home: Medicines Take over-the-counter and prescription medicines only as told by your doctor. If you were given a sedative during your procedure, do not drive or use machines until your doctor says that it is safe. A sedative is a medicine that helps you relax. If you were prescribed an antibiotic medicine, take it as told by your doctor. Do not stop taking it even if you start to feel better. Activity  Return to your normal activities when your doctor says that it is safe. Ask your doctor when it is okay for you to have sex. You may have to avoid lifting. Ask your doctor how much you can safely lift. General instructions  Drink enough water to keep your pee pale yellow. Watch your pee, poop, and semen for new bleeding or bleeding that gets worse. Keep all follow-up visits. Contact a doctor if: You have any of these: Blood clots in your pee or poop. Blood in your pee more than 2 weeks after the procedure. Blood in your semen more than 2 months after the procedure. New or worse bleeding in your pee, poop, or semen. Very bad belly pain. Your pee smells bad or unusual. You have trouble peeing. Your lower belly feels firm. You have problems getting an erection. You feel like you may vomit (are nauseous), or you vomit. Get help right away if: You have a fever or chills. You have bright red pee. You have very bad pain that does not get better with medicine. You cannot pee. Summary After this procedure, it is common to have pain and discomfort near your butt,  especially while sitting. You may have blood in your pee and poop. It is common to have blood in your semen. Get help right away if you have a fever or chills. This information is not intended to replace advice given to you by your health care provider. Make sure you discuss any questions you have with your health care provider. Document Revised: 03/26/2021 Document Reviewed: 03/26/2021 Elsevier Patient Education  [place].

## 2021-11-28 ENCOUNTER — Ambulatory Visit (INDEPENDENT_AMBULATORY_CARE_PROVIDER_SITE_OTHER): Payer: Medicare Other | Admitting: Urology

## 2021-11-28 ENCOUNTER — Encounter: Payer: Self-pay | Admitting: Urology

## 2021-11-28 ENCOUNTER — Other Ambulatory Visit: Payer: Self-pay

## 2021-11-28 VITALS — BP 129/79 | HR 79

## 2021-11-28 DIAGNOSIS — C61 Malignant neoplasm of prostate: Secondary | ICD-10-CM | POA: Diagnosis not present

## 2021-11-28 NOTE — Patient Instructions (Signed)
Prostate Cancer °The prostate is a small gland that helps make semen. It is located below a man's bladder, in front of the rectum. Prostate cancer is when abnormal cells grow in this gland. °What are the causes? °The cause of this condition is not known. °What increases the risk? °Being age 65 or older. °Having a family history of prostate cancer. °Having a family history of cancer of the breasts or ovaries. °Having genes that are passed from parent to child (inherited). °Having Lynch syndrome. °African American men and men of African descent are diagnosed with prostate cancer at higher rates than other men. °What are the signs or symptoms? °Problems peeing (urinating). This may include: °A stream that is weak, or pee that stops and starts. °Trouble starting or stopping your pee. °Trouble emptying all of your pee. °Needing to pee more often, especially at night. °Blood in your pee or semen. °Pain in the: °Lower back. °Lower belly (abdomen). °Hips. °Trouble getting an erection. °Weakness or numbness in the legs or feet. °How is this treated? °Treatment for this condition depends on: °How much the cancer has spread. °Your age. °The kind of treatment you want. °Your health. °Treatments include: °Being watched. This is called observation. You will be tested from time to time, but you will not get treated. Tests are to make sure that the cancer is not growing. °Surgery. This may be done to: °Take out (remove) the prostate. °Freeze and kill cancer cells. °Radiation. This uses a strong beam of energy to kill cancer cells. °Chemotherapy. This uses medicines that stop cancer cells from increasing. This kills cancer cells and healthy cells. °Targeted therapy. This kills cancer cells only. Healthy cells are not affected. °Hormone treatment. This stops the body from making hormones that help the cancer cells grow. °Follow these instructions at home: °Lifestyle °Do not smoke or use any products that contain nicotine or tobacco.  If you need help quitting, ask your doctor. °Eat a healthy diet. °Treatment may affect your ability to have sex. If you have a partner, touch, hold, hug, and caress your partner to have intimate moments. °Get plenty of sleep. °Ask your doctor for help to find a support group for men with prostate cancer. °General instructions °Take over-the-counter and prescription medicines only as told by your doctor. °If you have to go to the hospital, let your cancer doctor (oncologist) know. °Keep all follow-up visits. °Where to find more information °American Cancer Society: www.cancer.org °American Society of Clinical Oncology: www.cancer.net °National Cancer Institute: www.cancer.gov °Contact a doctor if: °You have new or more trouble peeing. °You have new or more blood in your pee. °You have new or more pain in your hips, back, or chest. °Get help right away if: °You have weakness in your legs. °You lose feeling in your legs. °You cannot control your pee or your poop (stool). °You have chills or a fever. °Summary °The prostate is a male gland that helps make semen. °Prostate cancer is when abnormal cells grow in this gland. °Treatment includes doing surgery, using medicines, using strong beams of energy, or watching without treatment. °Ask your doctor for help to find a support group for men with prostate cancer. °Contact a doctor if you have problems peeing or have any new pain that you did not have before. °This information is not intended to replace advice given to you by your health care provider. Make sure you discuss any questions you have with your health care provider. °Document Revised: 12/27/2020 Document Reviewed: 12/27/2020 °Elsevier   Patient Education © 2022 Elsevier Inc. ° °

## 2021-11-28 NOTE — Progress Notes (Signed)
11/28/2021 3:56 PM   Charles Moore 1956/05/31 [id number]  Referring provider: Carlena Hurl, PA-C [address] [place],  [place] [address]  Followup prostate cancer   HPI: Charles Moore is a 66yo here for followup after prostate biopsy. He is currently under active surveillance for prostate cancer. Biopsy revealed Gleason 3+3=6 in 3/12 cores, 5% of cores. He denies any worsening LUTS. No hematuria.    PMH: Past Medical History:  Diagnosis Date   Allergy    seasonal with rag weed and pollen    Asthma    rare wheezing   Hypertension 2010   Kidney stone    Nut allergy    Obesity    Wears glasses     Surgical History: Past Surgical History:  Procedure Laterality Date   LITHOTRIPSY      Home Medications:  Allergies as of 11/28/2021       Reactions   Other Anaphylaxis   Almonds, cashews, [place] nuts        Medication List        Accurate as of November 28, 2021  3:56 PM. If you have any questions, ask your nurse or doctor.          finasteride 5 MG tablet Commonly known as: PROSCAR Take 1 tablet (5 mg total) by mouth daily.   lisinopril-hydrochlorothiazide 20-25 MG tablet Commonly known as: ZESTORETIC Take 1 tablet by mouth daily.   metoprolol succinate 50 MG 24 hr tablet Commonly known as: Toprol XL Take 1 tablet (50 mg total) by mouth daily. Take with or immediately following a meal.   rosuvastatin 20 MG tablet Commonly known as: CRESTOR Take 1 tablet (20 mg total) by mouth daily.        Allergies:  Allergies  Allergen Reactions   Other Anaphylaxis    Almonds, cashews, [place] nuts    Family History: Family History  Problem Relation Age of Onset   Other Mother        pneumonia   Cancer Father        lung   Cancer Brother        lung   Alcohol abuse Brother    Heart disease Brother 40       CABG   Hypertension Brother    Colon polyps Brother    Colon cancer Paternal Grandfather    Esophageal cancer Neg Hx    Rectal  cancer Neg Hx    Stomach cancer Neg Hx     Social History:  reports that he has never smoked. He has never used smokeless tobacco. He reports current alcohol use. He reports that he does not use drugs.  ROS: All other review of systems were reviewed and are negative except what is noted above in HPI  Physical Exam: BP 129/79    Pulse 79   Constitutional:  Alert and oriented, No acute distress. HEENT: [place] AT, moist mucus membranes.  Trachea midline, no masses. Cardiovascular: No clubbing, cyanosis, or edema. Respiratory: Normal respiratory effort, no increased work of breathing. GI: Abdomen is soft, nontender, nondistended, no abdominal masses GU: No CVA tenderness.  Lymph: No cervical or inguinal lymphadenopathy. Skin: No rashes, bruises or suspicious lesions. Neurologic: Grossly intact, no focal deficits, moving all 4 extremities. Psychiatric: Normal mood and affect.  Laboratory Data: Lab Results  Component Value Date   WBC 7.8 07/04/2021   HGB 14.0 07/04/2021   HCT 42.5 07/04/2021   MCV 96 07/04/2021   PLT 212 07/04/2021  Lab Results  Component Value Date   CREATININE 1.23 07/04/2021    Lab Results  Component Value Date   PSA 4.66 (H) 09/18/2015   PSA 5.34 (H) 08/29/2015    No results found for: TESTOSTERONE  Lab Results  Component Value Date   HGBA1C 5.0 07/04/2021    Urinalysis    Component Value Date/Time   COLORURINE YELLOW 10/20/2015 0946   APPEARANCEUR Clear 08/15/2021 1020   LABSPEC 1.015 10/20/2015 0946   PHURINE 6.5 10/20/2015 0946   GLUCOSEU Negative 08/15/2021 1020   HGBUR SMALL (A) 10/20/2015 0946   BILIRUBINUR Negative 08/15/2021 1020   KETONESUR NEGATIVE 10/20/2015 0946   PROTEINUR Negative 08/15/2021 1020   PROTEINUR 100 (A) 10/20/2015 0946   UROBILINOGEN negative 09/18/2015 1707   NITRITE Negative 08/15/2021 1020   NITRITE NEGATIVE 10/20/2015 0946   LEUKOCYTESUR Negative 08/15/2021 1020    Lab Results  Component Value Date    LABMICR See below: 08/15/2021   WBCUA None seen 08/15/2021   LABEPIT None seen 08/15/2021   MUCUS Present 05/02/2021   BACTERIA None seen 08/15/2021    Pertinent Imaging:  Results for orders placed during the hospital encounter of 01/09/09  DG Abd 1 View  Narrative Clinical Data: Pre lithotripsy, right UPJ stone.  ABDOMEN - 1 VIEW  Comparison: CT abdomen pelvis 01/02/2009  Findings: A 7 mm stone is seen in the expected location of the right ureteral pelvic junction.  No additional radiopaque calculi project over the renal outlines or expected course of the ureters bilaterally.  Phleboliths are seen in the anatomic pelvis.  IMPRESSION: Right ureteral pelvic junction stone.  Provider: Mickie Kay, Janene Madeira  No results found for this or any previous visit.  No results found for this or any previous visit.  No results found for this or any previous visit.  No results found for this or any previous visit.  No results found for this or any previous visit.  No results found for this or any previous visit.  No results found for this or any previous visit.   Assessment & Plan:    1. Prostate cancer ([place]) Continue active surveillance. RTC 6 months with PSA   No follow-ups on file.  Nicolette Bang, MD  [facility] [place]

## 2021-12-30 ENCOUNTER — Other Ambulatory Visit: Payer: Self-pay | Admitting: Medical

## 2022-01-05 ENCOUNTER — Emergency Department (HOSPITAL_COMMUNITY)
Admission: EM | Admit: 2022-01-05 | Discharge: 2022-01-05 | Disposition: A | Discharge status: Home / Self Care | Payer: Medicare Other | Attending: Emergency Medicine | Admitting: Emergency Medicine

## 2022-01-05 ENCOUNTER — Other Ambulatory Visit: Payer: Self-pay

## 2022-01-05 ENCOUNTER — Emergency Department (HOSPITAL_COMMUNITY): Payer: Medicare Other

## 2022-01-05 ENCOUNTER — Encounter (HOSPITAL_COMMUNITY): Payer: Self-pay

## 2022-01-05 DIAGNOSIS — R112 Nausea with vomiting, unspecified: Secondary | ICD-10-CM | POA: Diagnosis not present

## 2022-01-05 DIAGNOSIS — I10 Essential (primary) hypertension: Secondary | ICD-10-CM

## 2022-01-05 DIAGNOSIS — R1013 Epigastric pain: Secondary | ICD-10-CM | POA: Diagnosis present

## 2022-01-05 DIAGNOSIS — Z79899 Other long term (current) drug therapy: Secondary | ICD-10-CM | POA: Insufficient documentation

## 2022-01-05 DIAGNOSIS — K8050 Calculus of bile duct without cholangitis or cholecystitis without obstruction: Secondary | ICD-10-CM

## 2022-01-05 DIAGNOSIS — K8020 Calculus of gallbladder without cholecystitis without obstruction: Secondary | ICD-10-CM

## 2022-01-05 DIAGNOSIS — N200 Calculus of kidney: Secondary | ICD-10-CM

## 2022-01-05 DIAGNOSIS — R1010 Upper abdominal pain, unspecified: Secondary | ICD-10-CM

## 2022-01-05 LAB — COMPREHENSIVE METABOLIC PANEL
ALT: 23 U/L (ref 0–44)
AST: 25 U/L (ref 15–41)
Albumin: 4.4 g/dL (ref 3.5–5.0)
Alkaline Phosphatase: 54 U/L (ref 38–126)
Anion gap: 9 (ref 5–15)
BUN: 13 mg/dL (ref 8–23)
CO2: 27 mmol/L (ref 22–32)
Calcium: 9.9 mg/dL (ref 8.9–10.3)
Chloride: 104 mmol/L (ref 98–111)
Creatinine, Ser: 1.29 mg/dL — ABNORMAL HIGH (ref 0.61–1.24)
GFR, Estimated: >60 mL/min (ref >60)
Glucose, Bld: 152 mg/dL — ABNORMAL HIGH (ref 70–99)
Potassium: 3.6 mmol/L (ref 3.5–5.1)
Sodium: 140 mmol/L (ref 135–145)
Total Bilirubin: 0.9 mg/dL (ref 0.3–1.2)
Total Protein: 8 g/dL (ref 6.5–8.1)

## 2022-01-05 LAB — CBC
HCT: 42.5 % (ref 39.0–52.0)
Hemoglobin: 13.9 g/dL (ref 13.0–17.0)
MCH: 32.2 pg (ref 26.0–34.0)
MCHC: 32.7 g/dL (ref 30.0–36.0)
MCV: 98.4 fL (ref 80.0–100.0)
Platelets: 169 10*3/uL (ref 150–400)
RBC: 4.32 MIL/uL (ref 4.22–5.81)
RDW: 12 % (ref 11.5–15.5)
WBC: 8.7 10*3/uL (ref 4.0–10.5)
nRBC: 0 % (ref 0.0–0.2)

## 2022-01-05 LAB — URINALYSIS, ROUTINE W REFLEX MICROSCOPIC
Bilirubin Urine: NEGATIVE
Glucose, UA: NEGATIVE mg/dL
Ketones, ur: NEGATIVE mg/dL
Leukocytes,Ua: NEGATIVE
Nitrite: NEGATIVE
Protein, ur: NEGATIVE mg/dL
Specific Gravity, Urine: 1.01 (ref 1.005–1.030)
pH: 5.5 (ref 5.0–8.0)

## 2022-01-05 LAB — URINALYSIS, MICROSCOPIC (REFLEX)

## 2022-01-05 LAB — TROPONIN I (HIGH SENSITIVITY): Troponin I (High Sensitivity): 4 ng/L (ref <18)

## 2022-01-05 LAB — LIPASE, BLOOD: Lipase: 31 U/L (ref 11–51)

## 2022-01-05 MED ORDER — PANTOPRAZOLE 80MG IVPB - SIMPLE MED
80.0000 mg | Freq: Once | INTRAVENOUS | Status: AC
  Administered 2022-01-05: 80 mg via INTRAVENOUS
  Filled 2022-01-05: qty 100

## 2022-01-05 MED ORDER — IOHEXOL 300 MG/ML  SOLN
100.0000 mL | Freq: Once | INTRAMUSCULAR | Status: AC | PRN
  Administered 2022-01-05: 100 mL via INTRAVENOUS

## 2022-01-05 MED ORDER — OXYCODONE-ACETAMINOPHEN 5-325 MG PO TABS: 1.0000 | ORAL_TABLET | Freq: Four times a day (QID) | ORAL | 0 refills | Status: DC | PRN

## 2022-01-05 MED ORDER — ONDANSETRON HCL 4 MG/2ML IJ SOLN
4.0000 mg | Freq: Once | INTRAMUSCULAR | Status: AC
  Administered 2022-01-05: 4 mg via INTRAVENOUS
  Filled 2022-01-05: qty 2

## 2022-01-05 MED ORDER — HYDROMORPHONE HCL 1 MG/ML IJ SOLN
1.0000 mg | Freq: Once | INTRAMUSCULAR | Status: AC
  Administered 2022-01-05: 1 mg via INTRAVENOUS
  Filled 2022-01-05: qty 1

## 2022-01-05 MED ORDER — ONDANSETRON 8 MG PO TBDP: 8.0000 mg | ORAL_TABLET | Freq: Three times a day (TID) | ORAL | 0 refills | Status: DC | PRN

## 2022-01-05 NOTE — ED Provider Notes (Addendum)
?[facility] ?Provider Note ? ? ?CSN: [id number] ?Arrival date & time: 01/05/22  0446 ? ?  ? ?History ? ?Chief Complaint  ?Patient presents with  ? Abdominal Pain  ? ? ?Charles Moore is a 66 y.o. male. ? ?Patient c/o epigastric pain. Symptoms acute onset last evening after dinner, becoming worse around 10 pm, constant, dull, moderate-severe, non radiating. No hx same pain. No hx pud, pancreatitis or gallstones. +hx kidney stones, but that pain was different. Denies back/flank pain. No chest pain or sob. +nausea, emesis x 1, not bloody or bilious. No dysuria or hematuria. No cough or uri symptoms. No pleuritic pain. No leg pain or swelling. No known ill contacts or bad food ingestion.  No fever or chills.  ? ?The history is provided by the patient, a significant other and medical records.  ?Abdominal Pain ?Associated symptoms: nausea and vomiting   ?Associated symptoms: no chest pain, no cough, no dysuria, no fever, no hematuria, no shortness of breath and no sore throat   ? ?  ? ?Home Medications ?Prior to Admission medications   ?Medication Sig Start Date End Date Taking? Authorizing Provider  ?finasteride (PROSCAR) 5 MG tablet Take 1 tablet (5 mg total) by mouth daily. 08/15/21   McKenzie, Candee Furbish, MD  ?lisinopril-hydrochlorothiazide (ZESTORETIC) 20-25 MG tablet TAKE ONE TABLET BY MOUTH DAILY 12/31/21   Tysinger, Camelia Eng, PA-C  ?metoprolol succinate (TOPROL XL) 50 MG 24 hr tablet Take 1 tablet (50 mg total) by mouth daily. Take with or immediately following a meal. 03/06/21   Tysinger, Camelia Eng, PA-C  ?rosuvastatin (CRESTOR) 20 MG tablet Take 1 tablet (20 mg total) by mouth daily. 03/06/21   Tysinger, Camelia Eng, PA-C  ?   ? ?Allergies    ?Other   ? ?Review of Systems   ?Review of Systems  ?Constitutional:  Negative for fever.  ?HENT:  Negative for sore throat.   ?Eyes:  Negative for redness.  ?Respiratory:  Negative for cough and shortness of breath.   ?Cardiovascular:  Negative for chest pain.   ?Gastrointestinal:  Positive for abdominal pain, nausea and vomiting.  ?Genitourinary:  Negative for dysuria, flank pain and hematuria.  ?Musculoskeletal:  Negative for back pain and neck pain.  ?Skin:  Negative for rash.  ?Neurological:  Negative for headaches.  ?Hematological:  Does not bruise/bleed easily.  ?Psychiatric/Behavioral:  Negative for confusion.   ? ?Physical Exam ?Updated Vital Signs ?Ht 1.753 m ('5\' 9"'$ )   Wt 123.8 kg   BMI 40.30 kg/m?  ?Physical Exam ?Vitals and nursing note reviewed.  ?Constitutional:   ?   Appearance: Normal appearance. He is well-developed.  ?HENT:  ?   Head: Atraumatic.  ?   Nose: Nose normal.  ?   Mouth/Throat:  ?   Mouth: Mucous membranes are moist.  ?Eyes:  ?   General: No scleral icterus. ?   Conjunctiva/sclera: Conjunctivae normal.  ?Neck:  ?   Trachea: No tracheal deviation.  ?Cardiovascular:  ?   Rate and Rhythm: Normal rate and regular rhythm.  ?   Pulses: Normal pulses.  ?   Heart sounds: Normal heart sounds. No murmur heard. ?  No friction rub. No gallop.  ?Pulmonary:  ?   Effort: Pulmonary effort is normal. No accessory muscle usage or respiratory distress.  ?   Breath sounds: Normal breath sounds.  ?Abdominal:  ?   General: Bowel sounds are normal. There is no distension.  ?   Palpations: Abdomen is soft. There  is no mass.  ?   Tenderness: There is abdominal tenderness. There is no guarding or rebound.  ?   Hernia: No hernia is present.  ?   Comments: Epigastric and upper abd tenderness.   ?Genitourinary: ?   Comments: No cva tenderness. ?Musculoskeletal:     ?   General: No swelling or tenderness.  ?   Cervical back: Normal range of motion and neck supple. No rigidity.  ?   Right lower leg: No edema.  ?   Left lower leg: No edema.  ?Skin: ?   General: Skin is warm and dry.  ?   Findings: No rash.  ?Neurological:  ?   Mental Status: He is alert.  ?   Comments: Alert, speech clear.   ?Psychiatric:     ?   Mood and Affect: Mood normal.  ? ? ?ED Results / Procedures /  Treatments   ?Labs ?(all labs ordered are listed, but only abnormal results are displayed) ?Results for orders placed or performed during the hospital encounter of 01/05/22  ?CBC  ?Result Value Ref Range  ? WBC 8.7 4.0 - 10.5 K/uL  ? RBC 4.32 4.22 - 5.81 MIL/uL  ? Hemoglobin 13.9 13.0 - 17.0 g/dL  ? HCT 42.5 39.0 - 52.0 %  ? MCV 98.4 80.0 - 100.0 fL  ? MCH 32.2 26.0 - 34.0 pg  ? MCHC 32.7 30.0 - 36.0 g/dL  ? RDW 12.0 11.5 - 15.5 %  ? Platelets 169 150 - 400 K/uL  ? nRBC 0.0 0.0 - 0.2 %  ?Comprehensive metabolic panel  ?Result Value Ref Range  ? Sodium 140 135 - 145 mmol/L  ? Potassium 3.6 3.5 - 5.1 mmol/L  ? Chloride 104 98 - 111 mmol/L  ? CO2 27 22 - 32 mmol/L  ? Glucose, Bld 152 (H) 70 - 99 mg/dL  ? BUN 13 8 - 23 mg/dL  ? Creatinine, Ser 1.29 (H) 0.61 - 1.24 mg/dL  ? Calcium 9.9 8.9 - 10.3 mg/dL  ? Total Protein 8.0 6.5 - 8.1 g/dL  ? Albumin 4.4 3.5 - 5.0 g/dL  ? AST 25 15 - 41 U/L  ? ALT 23 0 - 44 U/L  ? Alkaline Phosphatase 54 38 - 126 U/L  ? Total Bilirubin 0.9 0.3 - 1.2 mg/dL  ? GFR, Estimated >60 >60 mL/min  ? Anion gap 9 5 - 15  ?Lipase, blood  ?Result Value Ref Range  ? Lipase 31 11 - 51 U/L  ?Troponin I (High Sensitivity)  ?Result Value Ref Range  ? Troponin I (High Sensitivity) 4 <18 ng/L  ? ? ?EKG ?EKG Interpretation ? ?Date/Time:  Saturday January 05 2022 05:21:03 EDT ?Ventricular Rate:  70 ?PR Interval:  167 ?QRS Duration: 93 ?QT Interval:  423 ?QTC Calculation: 457 ?R Axis:   -88 ?Text Interpretation: Sinus rhythm Left anterior fascicular block Confirmed by Lajean Saver [phone]) on 01/05/2022 5:25:29 AM ? ?Radiology ?CT Abdomen Pelvis W Contrast ? ?Result Date: 01/05/2022 ?CLINICAL DATA:  66 year old male with history of upper abdominal pain since yesterday evening. EXAM: CT ABDOMEN AND PELVIS WITH CONTRAST TECHNIQUE: Multidetector CT imaging of the abdomen and pelvis was performed using the standard protocol following bolus administration of intravenous contrast. RADIATION DOSE REDUCTION: This exam was  performed according to the departmental dose-optimization program which includes automated exposure control, adjustment of the mA and/or kV according to patient size and/or use of iterative reconstruction technique. CONTRAST:  141m OMNIPAQUE IOHEXOL 300 MG/ML  SOLN COMPARISON:  CT the abdomen and pelvis 01/02/2009. FINDINGS: Lower chest: Linear scarring or atelectasis is noted in the lung bases bilaterally (left-greater-than-right). Atherosclerotic calcifications in the descending thoracic aorta as well as the left anterior descending coronary artery. Calcifications of the aortic valve and mitral annulus. Hepatobiliary: No suspicious cystic or solid hepatic lesions. No intra or extrahepatic biliary ductal dilatation. Numerous calcified gallstones are noted within the lumen of the gallbladder. There is also some amorphous high attenuation material in the gallbladder, presumably biliary sludge. Gallbladder is only moderately distended. No definite gallbladder wall thickening or pericholecystic fluid or surrounding inflammatory changes are noted to suggest an acute cholecystitis at this time. Pancreas: No pancreatic mass. No pancreatic ductal dilatation. No pancreatic or peripancreatic fluid collections or inflammatory changes. Spleen: Unremarkable. Adrenals/Urinary Tract: Nonobstructive calculi are noted within both of the renal collecting systems, largest of which measures 3 mm in the upper pole collecting system of the right kidney. 2.1 cm low-attenuation lesion in the lower pole of the left kidney, compatible with a simple cyst. No aggressive appearing renal lesions. No hydroureteronephrosis. Urinary bladder is unremarkable in appearance. Bilateral adrenal glands are normal in appearance. Stomach/Bowel: The appearance of the stomach is normal. No pathologic dilatation of small bowel or colon. Normal appendix. Vascular/Lymphatic: Aortic atherosclerosis, without evidence of aneurysm or dissection in the abdominal or  pelvic vasculature. No lymphadenopathy noted in the abdomen or pelvis. Reproductive: Prostate gland and seminal vesicles are unremarkable in appearance. Other: No significant volume of ascites.  No pneumop

## 2022-01-05 NOTE — Discharge Instructions (Addendum)
It was our pleasure to provide your ER care today - we hope that you feel better. ? ?Your CT scan was read as showing: 1. Cholelithiasis and biliary sludge in the gallbladder. No definitive imaging findings to suggest an acute cholecystitis at this time. 2. Incidental findings (I.e. not related to your pain) include nonobstructive calculi in the collecting systems of both kidneys measuring up to 3 mm in the upper pole collecting system of the right kidney. No ureteral stones or findings of urinary tract obstruction, and 3. Aortic atherosclerosis.  ? ?Follow up with general surgeon in the coming week  - call office Monday AM to arrange appointment.  ? ?Take motrin or aleve as need for pain. You may also take percocet as need for pain. No driving for the next 6 hours or when taking percocet. Also, do not take tylenol or acetaminophen containing medication when taking percocet. Take zofran as need for nausea.  ? ?Return to ER if worse, new symptoms, fevers, worsening or severe pain, persistent vomiting, or other concern.  ? ?

## 2022-01-05 NOTE — ED Triage Notes (Signed)
Pt from home with c/o abd pain that started last night around 10 pm. Pt reports having small formed BM and vomiting once around this time also. Pt reports some gas and bloating as well. Reports having some chills at home. Oral temp in triage is 98.1 ?

## 2022-01-10 ENCOUNTER — Other Ambulatory Visit: Payer: Self-pay

## 2022-01-10 ENCOUNTER — Encounter: Payer: Self-pay | Admitting: Surgery

## 2022-01-10 ENCOUNTER — Ambulatory Visit (INDEPENDENT_AMBULATORY_CARE_PROVIDER_SITE_OTHER): Payer: Medicare Other | Admitting: Surgery

## 2022-01-10 VITALS — BP 126/80 | HR 86 | Temp 98.6°F | Resp 16 | Ht 69.0 in | Wt 264.0 lb

## 2022-01-10 DIAGNOSIS — K8020 Calculus of gallbladder without cholecystitis without obstruction: Secondary | ICD-10-CM | POA: Diagnosis not present

## 2022-01-10 NOTE — Progress Notes (Signed)
[facility] History and Physical ? ?Reason for Referral: Cholelithiasis ?Referring Physician: Lajean Saver, MD ? ?Chief Complaint   ?New Patient (Initial Visit) ?  ? ? ?Charles Moore is a 66 y.o. male.  ?HPI: Patient presents for evaluation of his gallstones.  He was seen in the ED on 3/25, at which time he was noted to have gallstones and biliary sludge without evidence of cholecystitis on CT abdomen and pelvis.  His blood work was also within normal limits.  He states that he began having epigastric/right upper quadrant abdominal pain this weekend after eating old potato chips and some sausage.  This was the first episode of this type of pain he is ever had.  He denies nausea and vomiting at this time, however he did have an episode of emesis prior to evaluation in the emergency department.  He does confirm feeling constipated since leaving the hospital, and he was prescribed opioid pain medications at that time.  He denies any history of any other surgeries.  His medical history is significant for hypertension and hyperlipidemia.  He denies use of blood thinning medications.  He denies use of tobacco products and illicit drugs.  He very rarely will drink alcohol. ? ?Past Medical History:  ?Diagnosis Date  ? 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 Asthma   ? rare wheezing  ? Hypertension 2010  ? Kidney stone   ? Nut allergy   ? Obesity   ? Wears glasses   ? ? ?Past Surgical History:  ?Procedure Laterality Date  ? LITHOTRIPSY    ? ? ?Family History  ?Problem Relation Age of Onset  ? Other Mother   ?     pneumonia  ? Cancer Father   ?     lung  ? Cancer Brother   ?     lung  ? Alcohol abuse Brother   ? Heart disease Brother 42  ?     CABG  ? Hypertension Brother   ? Colon polyps Brother   ? Colon cancer Paternal Grandfather   ? Esophageal cancer Neg Hx   ? Rectal cancer Neg Hx   ? Stomach cancer Neg Hx   ? ? ?Social History  ? ?Tobacco Use  ? Smoking status: Never  ? Smokeless tobacco:  Never  ?Vaping Use  ? Vaping Use: Never used  ?Substance Use Topics  ? Alcohol use: Yes  ?  Alcohol/week: 0.0 standard drinks  ?  Comment: 1 beer a month  ? Drug use: No  ? ? ?Medications: I have reviewed the patient's current medications. ?Allergies as of 01/10/2022   ? ?   Reactions  ? Other Anaphylaxis  ? Almonds, cashews, [place] nuts  ? ?  ? ?  ?Medication List  ?  ? ?  ? Accurate as of January 10, 2022 11:42 AM. If you have any questions, ask your nurse or doctor.  ?  ?  ? ?  ? ?finasteride 5 MG tablet ?Commonly known as: PROSCAR ?Take 1 tablet (5 mg total) by mouth daily. ?  ?lisinopril-hydrochlorothiazide 20-25 MG tablet ?Commonly known as: ZESTORETIC ?TAKE ONE TABLET BY MOUTH DAILY ?  ?metoprolol succinate 50 MG 24 hr tablet ?Commonly known as: Toprol XL ?Take 1 tablet (50 mg total) by mouth daily. Take with or immediately following a meal. ?  ?ondansetron 8 MG disintegrating tablet ?Commonly known as: ZOFRAN-ODT ?Take 1 tablet (8 mg total) by mouth every 8 (eight) hours  as needed for nausea or vomiting. ?  ?oxyCODONE-acetaminophen 5-325 MG tablet ?Commonly known as: PERCOCET/ROXICET ?Take 1-2 tablets by mouth every 6 (six) hours as needed for severe pain. ?  ?rosuvastatin 20 MG tablet ?Commonly known as: CRESTOR ?Take 1 tablet (20 mg total) by mouth daily. ?  ? ?  ? ? ? ?ROS:  ?Constitutional: positive for chills, negative for fatigue and fevers ?Eyes: negative for visual disturbance and pain ?Ears, nose, mouth, throat, and face: positive for sinus problems, negative for ear drainage and sore throat ?Respiratory: positive for shortness of breath, negative for cough and wheezing ?Cardiovascular: positive for chest pain ?Gastrointestinal: positive for abdominal pain, nausea, and reflux symptoms ?Genitourinary:positive for frequency, negative for dysuria and urinary retention ?Integument/breast: negative for dryness and rash ?Hematologic/lymphatic: negative for bleeding and  lymphadenopathy ?Musculoskeletal:positive for back pain and neck pain, negative for joint pain ?Neurological: negative for dizziness and tremors ?Endocrine: negative for temperature intolerance ? ?Blood pressure 126/80, pulse 86, temperature 98.6 ?F (37 ?C), temperature source Other (Comment), resp. rate 16, height '5\' 9"'$  (1.753 m), weight 264 lb (119.7 kg), SpO2 96 %. ?Physical Exam ?Vitals reviewed.  ?Constitutional:   ?   Appearance: Normal appearance.  ?HENT:  ?   Head: Normocephalic and atraumatic.  ?Eyes:  ?   Extraocular Movements: Extraocular movements intact.  ?   Pupils: Pupils are equal, round, and reactive to light.  ?Cardiovascular:  ?   Rate and Rhythm: Normal rate and regular rhythm.  ?Pulmonary:  ?   Effort: Pulmonary effort is normal.  ?   Breath sounds: Normal breath sounds.  ?Abdominal:  ?   Comments: Abdomen soft, nondistended, no percussion tenderness, mild epigastric tenderness to palpation, no rigidity, guarding, rebound tenderness; negative Murphy sign  ?Musculoskeletal:     ?   General: Normal range of motion.  ?   Cervical back: Normal range of motion.  ?Skin: ?   General: Skin is warm and dry.  ?Neurological:  ?   General: No focal deficit present.  ?   Mental Status: He is alert and oriented to person, place, and time.  ?Psychiatric:     ?   Mood and Affect: Mood normal.     ?   Behavior: Behavior normal.  ? ? ?Results: ?Recent Results (from the past 2160 hour(s))  ?CBC     Status: None  ? Collection Time: 01/05/22  5:25 AM  ?Result Value Ref Range  ? WBC 8.7 4.0 - 10.5 K/uL  ? RBC 4.32 4.22 - 5.81 MIL/uL  ? Hemoglobin 13.9 13.0 - 17.0 g/dL  ? HCT 42.5 39.0 - 52.0 %  ? MCV 98.4 80.0 - 100.0 fL  ? MCH 32.2 26.0 - 34.0 pg  ? MCHC 32.7 30.0 - 36.0 g/dL  ? RDW 12.0 11.5 - 15.5 %  ? Platelets 169 150 - 400 K/uL  ? nRBC 0.0 0.0 - 0.2 %  ?  Comment: Performed at [facility], [address] [place], [place] [address]  ?Comprehensive metabolic panel     Status: Abnormal  ? Collection Time:  01/05/22  5:25 AM  ?Result Value Ref Range  ? Sodium 140 135 - 145 mmol/L  ? Potassium 3.6 3.5 - 5.1 mmol/L  ? Chloride 104 98 - 111 mmol/L  ? CO2 27 22 - 32 mmol/L  ? Glucose, Bld 152 (H) 70 - 99 mg/dL  ?  Comment: Glucose reference range applies only to samples taken after fasting for at least 8 hours.  ?  BUN 13 8 - 23 mg/dL  ? Creatinine, Ser 1.29 (H) 0.61 - 1.24 mg/dL  ? Calcium 9.9 8.9 - 10.3 mg/dL  ? Total Protein 8.0 6.5 - 8.1 g/dL  ? Albumin 4.4 3.5 - 5.0 g/dL  ? AST 25 15 - 41 U/L  ? ALT 23 0 - 44 U/L  ? Alkaline Phosphatase 54 38 - 126 U/L  ? Total Bilirubin 0.9 0.3 - 1.2 mg/dL  ? GFR, Estimated >60 >60 mL/min  ?  Comment: (NOTE) ?Calculated using the CKD-EPI Creatinine Equation (2021) ?  ? Anion gap 9 5 - 15  ?  Comment: Performed at [facility], [address] [place], [place] [address]  ?Lipase, blood     Status: None  ? Collection Time: 01/05/22  5:25 AM  ?Result Value Ref Range  ? Lipase 31 11 - 51 U/L  ?  Comment: Performed at [facility], [address], [place], [place] [address]  ?Troponin I (High Sensitivity)     Status: None  ? Collection Time: 01/05/22  5:25 AM  ?Result Value Ref Range  ? Troponin I (High Sensitivity) 4 <18 ng/L  ?  Comment: (NOTE) ?Elevated high sensitivity troponin I (hsTnI) values and significant  ?changes across serial measurements may suggest ACS but many other  ?chronic and acute conditions are known to elevate hsTnI results.  ?Refer to the "Links" section for chest pain algorithms and additional  ?guidance. ?Performed at [facility], [address] [place], [place] [address] ?  ?Urinalysis, Routine w reflex microscopic Urine, Clean Catch     Status: Abnormal  ? Collection Time: 01/05/22  8:16 AM  ?Result Value Ref Range  ? Color, Urine YELLOW YELLOW  ? APPearance CLEAR CLEAR  ? Specific Gravity, Urine 1.010 1.005 - 1.030  ? pH 5.5 5.0 - 8.0  ? Glucose, UA NEGATIVE NEGATIVE mg/dL  ? Hgb urine dipstick TRACE (A) NEGATIVE  ? Bilirubin Urine NEGATIVE NEGATIVE  ? Ketones,  ur NEGATIVE NEGATIVE mg/dL  ? Protein, ur NEGATIVE NEGATIVE mg/dL  ? Nitrite NEGATIVE NEGATIVE  ? Leukocytes,Ua NEGATIVE NEGATIVE  ?  Comment: Performed at [facility], [address] [place],  [address]  ?Urinalysis, Microscopic (reflex)

## 2022-01-17 NOTE — Patient Instructions (Signed)
? ? ? ? ? ? ? ? Charles Moore ? 01/17/2022  ?  ? '@PREFPERIOPPHARMACY'$ @ ? ? Your procedure is scheduled on  01/25/2022. ? ? Report to [facility] at  0900 A.M. ? ? Call this number if you have problems the morning of surgery: ? [phone] ? ? Remember: ? Do not eat or drink after midnight. ?  ?  ? Take these medicines the morning of surgery with A SIP OF WATER  ? ?proscar, metoprolol, zofran(if needed), oxycodone(if needed). ?  ? Do not wear jewelry, make-up or nail polish. ? Do not wear lotions, powders, or perfumes, or deodorant. ? Do not shave 48 hours prior to surgery.  Men may shave face and neck. ? Do not bring valuables to the hospital. ? [facility] is not responsible for any belongings or valuables. ? ?Contacts, dentures or bridgework may not be worn into surgery.  Leave your suitcase in the car.  After surgery it may be brought to your room. ? ?For patients admitted to the hospital, discharge time will be determined by your treatment team. ? ?Patients discharged the day of surgery will not be allowed to drive home and must have someone with them for 24 hours.  ? ? ?Special instructions:   DO NOT smoke tobacco or vape for 24 hours before your procedure. ? ?Please read over the following fact sheets that you were given. ?Coughing and Deep Breathing, Surgical Site Infection Prevention, Anesthesia Post-op Instructions, and Care and Recovery After Surgery ?  ? ? ? Minimally Invasive Cholecystectomy, Care After ?The following information offers guidance on how to care for yourself after your procedure. Your health care provider may also give you more specific instructions. If you have problems or questions, contact your health care provider. ?What can I expect after the procedure? ?After the procedure, it is common to have: ?Pain at your incision sites. You will be given medicines to control this pain. ?Mild nausea or vomiting. ?Bloating and possible shoulder pain from the gas that was used during the  procedure. ?Follow these instructions at home: ?Medicines ?Take over-the-counter and prescription medicines only as told by your health care provider. ?If you were prescribed an antibiotic medicine, take it as told by your health care provider. Do not stop using the antibiotic even if you start to feel better. ?Ask your health care provider if the medicine prescribed to you: ?Requires you to avoid driving or using machinery. ?Can cause constipation. You may need to take these actions to prevent or treat constipation: ?Drink enough fluid to keep your urine pale yellow. ?Take over-the-counter or prescription medicines. ?Eat foods that are high in fiber, such as beans, whole grains, and fresh fruits and vegetables. ?Limit foods that are high in fat and processed sugars, such as fried or sweet foods. ?Incision care ? ?Follow instructions from your health care provider about how to take care of your incisions. Make sure you: ?Wash your hands with soap and water for at least 20 seconds before and after you change your bandage (dressing). If soap and water are not available, use hand sanitizer. ?Change your dressing as told by your health care provider. ?Leave stitches (sutures), skin glue, or adhesive strips in place. These skin closures may need to be in place for 2 weeks or longer. If adhesive strip edges start to loosen and curl up, you may trim the loose edges. Do not remove adhesive strips completely unless your health care provider tells you to do that. ?Do  not take baths, swim, or use a hot tub until your health care provider approves. Ask your health care provider if you may take showers. You may only be allowed to take sponge baths. ?Check your incision area every day for signs of infection. Check for: ?More redness, swelling, or pain. ?Fluid or blood. ?Warmth. ?Pus or a bad smell. ?Activity ?Rest as told by your health care provider. Do not do activities that require a lot of effort. ?Avoid sitting for a long  time without moving. Get up to take short walks every 1-2 hours. This is important to improve blood flow and breathing. Ask for help if you feel weak or unsteady. ?Do not lift anything that is heavier than 10 lb (4.5 kg), or the limit that you are told, until your health care provider says that it is safe. ?Do not play contact sports until your health care provider approves. ?Do not return to work or school until your health care provider approves. ?Return to your normal activities as told by your health care provider. Ask your health care provider what activities are safe for you. ?General instructions ?If you were given a sedative during the procedure, it can affect you for several hours. Do not drive or operate machinery until your health care provider says that it is safe. ?Keep all follow-up visits. This is important. ?Contact a health care provider if: ?You develop a rash. ?You have more redness, swelling, or pain around your incisions. ?You have fluid or blood coming from your incisions. ?Your incisions feel warm to the touch. ?You have pus or a bad smell coming from your incisions. ?You have a fever. ?One or more of your incisions breaks open. ?Get help right away if: ?You have trouble breathing. ?You have chest pain. ?You have more pain in your shoulders. ?You faint or feel dizzy when you stand. ?You have severe pain in your abdomen. ?You have nausea or vomiting that lasts for more than one day. ?You have leg pain that is new or unusual, or if it is localized to one specific spot. ?These symptoms may represent a serious problem that is an emergency. Do not wait to see if the symptoms will go away. Get medical help right away. Call your local emergency services (911 in the [place] Do not drive yourself to the hospital. ?Summary ?After your procedure, it is common to have pain at the incision sites. You may also have nausea or bloating. ?Follow your health care provider's instructions about medicine, activity  restrictions, and caring for your incision areas. Do not do activities that require a lot of effort. ?Contact a health care provider if you have a fever or other signs of infection, such as more redness, swelling, or pain around the incisions. ?Get help right away if you have chest pain, increasing pain in the shoulders, or trouble breathing. ?This information is not intended to replace advice given to you by your health care provider. Make sure you discuss any questions you have with your health care provider. ?Document Revised: 04/03/2021 Document Reviewed: 04/03/2021 ?Elsevier Patient Education ? [place]. ?General Anesthesia, Adult, Care After ?This sheet gives you information about how to care for yourself after your procedure. Your health care provider may also give you more specific instructions. If you have problems or questions, contact your health care provider. ?What can I expect after the procedure? ?After the procedure, the following side effects are common: ?Pain or discomfort at the IV site. ?Nausea. ?Vomiting. ?Sore throat. ?Trouble  concentrating. ?Feeling cold or chills. ?Feeling weak or tired. ?Sleepiness and fatigue. ?Soreness and body aches. These side effects can affect parts of the body that were not involved in surgery. ?Follow these instructions at home: ?For the time period you were told by your health care provider: ? ?Rest. ?Do not participate in activities where you could fall or become injured. ?Do not drive or use machinery. ?Do not drink alcohol. ?Do not take sleeping pills or medicines that cause drowsiness. ?Do not make important decisions or sign legal documents. ?Do not take care of children on your own. ?Eating and drinking ?Follow any instructions from your health care provider about eating or drinking restrictions. ?When you feel hungry, start by eating small amounts of foods that are soft and easy to digest (bland), such as toast. Gradually return to your regular  diet. ?Drink enough fluid to keep your urine pale yellow. ?If you vomit, rehydrate by drinking water, juice, or clear broth. ?General instructions ?If you have sleep apnea, surgery and certain medicines can increase yo

## 2022-01-18 NOTE — H&P (Signed)
[facility] History and Physical ?  ?Reason for Referral: Cholelithiasis ?Referring Physician: Lajean Saver, MD ?  ?Chief Complaint   ?New Patient (Initial Visit) ?   ?  ?  ?Charles Moore is a 66 y.o. male.  ?HPI: Patient presents for evaluation of his gallstones.  He was seen in the ED on 3/25, at which time he was noted to have gallstones and biliary sludge without evidence of cholecystitis on CT abdomen and pelvis.  His blood work was also within normal limits.  He states that he began having epigastric/right upper quadrant abdominal pain this weekend after eating old potato chips and some sausage.  This was the first episode of this type of pain he is ever had.  He denies nausea and vomiting at this time, however he did have an episode of emesis prior to evaluation in the emergency department.  He does confirm feeling constipated since leaving the hospital, and he was prescribed opioid pain medications at that time.  He denies any history of any other surgeries.  His medical history is significant for hypertension and hyperlipidemia.  He denies use of blood thinning medications.  He denies use of tobacco products and illicit drugs.  He very rarely will drink alcohol. ?  ?    ?Past Medical History:  ?Diagnosis Date  ? Allergy    ?  seasonal with rag weed and pollen   ? Asthma    ?  rare wheezing  ? Hypertension 2010  ? Kidney stone    ? Nut allergy    ? Obesity    ? Wears glasses    ?  ?  ?     ?Past Surgical History:  ?Procedure Laterality Date  ? LITHOTRIPSY      ?  ?  ?     ?Family History  ?Problem Relation Age of Onset  ? Other Mother    ?      pneumonia  ? Cancer Father    ?      lung  ? Cancer Brother    ?      lung  ? Alcohol abuse Brother    ? Heart disease Brother 53  ?      CABG  ? Hypertension Brother    ? Colon polyps Brother    ? Colon cancer Paternal Grandfather    ? Esophageal cancer Neg Hx    ? Rectal cancer Neg Hx    ? Stomach cancer Neg Hx    ?  ?  ?Social History  ?  ?      ?Tobacco Use  ? Smoking status: Never  ? Smokeless tobacco: Never  ?Vaping Use  ? Vaping Use: Never used  ?Substance Use Topics  ? Alcohol use: Yes  ?    Alcohol/week: 0.0 standard drinks  ?    Comment: 1 beer a month  ? Drug use: No  ?  ?  ?Medications: I have reviewed the patient's current medications. ?Allergies as of 01/10/2022   ?  ?    Reactions  ?  Other Anaphylaxis  ?  Almonds, cashews, [place] nuts  ?  ?   ?  ?   ?Medication List  ?   ?  ?   ? Accurate as of January 10, 2022 11:42 AM. If you have any questions, ask your nurse or doctor.  ?  ?   ?  ?   ?  ?finasteride 5 MG tablet ?Commonly known as: PROSCAR ?Take  1 tablet (5 mg total) by mouth daily. ?   ?lisinopril-hydrochlorothiazide 20-25 MG tablet ?Commonly known as: ZESTORETIC ?TAKE ONE TABLET BY MOUTH DAILY ?   ?metoprolol succinate 50 MG 24 hr tablet ?Commonly known as: Toprol XL ?Take 1 tablet (50 mg total) by mouth daily. Take with or immediately following a meal. ?   ?ondansetron 8 MG disintegrating tablet ?Commonly known as: ZOFRAN-ODT ?Take 1 tablet (8 mg total) by mouth every 8 (eight) hours as needed for nausea or vomiting. ?   ?oxyCODONE-acetaminophen 5-325 MG tablet ?Commonly known as: PERCOCET/ROXICET ?Take 1-2 tablets by mouth every 6 (six) hours as needed for severe pain. ?   ?rosuvastatin 20 MG tablet ?Commonly known as: CRESTOR ?Take 1 tablet (20 mg total) by mouth daily. ?   ?  ?   ?  ?  ?  ?ROS:  ?Constitutional: positive for chills, negative for fatigue and fevers ?Eyes: negative for visual disturbance and pain ?Ears, nose, mouth, throat, and face: positive for sinus problems, negative for ear drainage and sore throat ?Respiratory: positive for shortness of breath, negative for cough and wheezing ?Cardiovascular: positive for chest pain ?Gastrointestinal: positive for abdominal pain, nausea, and reflux symptoms ?Genitourinary:positive for frequency, negative for dysuria and urinary retention ?Integument/breast: negative for dryness and  rash ?Hematologic/lymphatic: negative for bleeding and lymphadenopathy ?Musculoskeletal:positive for back pain and neck pain, negative for joint pain ?Neurological: negative for dizziness and tremors ?Endocrine: negative for temperature intolerance ?  ?Blood pressure 126/80, pulse 86, temperature 98.6 ?F (37 ?C), temperature source Other (Comment), resp. rate 16, height '5\' 9"'$  (1.753 m), weight 264 lb (119.7 kg), SpO2 96 %. ?Physical Exam ?Vitals reviewed.  ?Constitutional:   ?   Appearance: Normal appearance.  ?HENT:  ?   Head: Normocephalic and atraumatic.  ?Eyes:  ?   Extraocular Movements: Extraocular movements intact.  ?   Pupils: Pupils are equal, round, and reactive to light.  ?Cardiovascular:  ?   Rate and Rhythm: Normal rate and regular rhythm.  ?Pulmonary:  ?   Effort: Pulmonary effort is normal.  ?   Breath sounds: Normal breath sounds.  ?Abdominal:  ?   Comments: Abdomen soft, nondistended, no percussion tenderness, mild epigastric tenderness to palpation, no rigidity, guarding, rebound tenderness; negative Murphy sign  ?Musculoskeletal:     ?   General: Normal range of motion.  ?   Cervical back: Normal range of motion.  ?Skin: ?   General: Skin is warm and dry.  ?Neurological:  ?   General: No focal deficit present.  ?   Mental Status: He is alert and oriented to person, place, and time.  ?Psychiatric:     ?   Mood and Affect: Mood normal.     ?   Behavior: Behavior normal.  ?  ?  ?Results: ?      ?Recent Results (from the past 2160 hour(s))  ?CBC     Status: None  ?  Collection Time: 01/05/22  5:25 AM  ?Result Value Ref Range  ?  WBC 8.7 4.0 - 10.5 K/uL  ?  RBC 4.32 4.22 - 5.81 MIL/uL  ?  Hemoglobin 13.9 13.0 - 17.0 g/dL  ?  HCT 42.5 39.0 - 52.0 %  ?  MCV 98.4 80.0 - 100.0 fL  ?  MCH 32.2 26.0 - 34.0 pg  ?  MCHC 32.7 30.0 - 36.0 g/dL  ?  RDW 12.0 11.5 - 15.5 %  ?  Platelets 169 150 - 400 K/uL  ?  nRBC 0.0 0.0 - 0.2 %  ?    Comment: Performed at [facility], [address], [place], [place] [address]   ?Comprehensive metabolic panel     Status: Abnormal  ?  Collection Time: 01/05/22  5:25 AM  ?Result Value Ref Range  ?  Sodium 140 135 - 145 mmol/L  ?  Potassium 3.6 3.5 - 5.1 mmol/L  ?  Chloride 104 98 - 111 mmol/L  ?  CO2 27 22 - 32 mmol/L  ?  Glucose, Bld 152 (H) 70 - 99 mg/dL  ?    Comment: Glucose reference range applies only to samples taken after fasting for at least 8 hours.  ?  BUN 13 8 - 23 mg/dL  ?  Creatinine, Ser 1.29 (H) 0.61 - 1.24 mg/dL  ?  Calcium 9.9 8.9 - 10.3 mg/dL  ?  Total Protein 8.0 6.5 - 8.1 g/dL  ?  Albumin 4.4 3.5 - 5.0 g/dL  ?  AST 25 15 - 41 U/L  ?  ALT 23 0 - 44 U/L  ?  Alkaline Phosphatase 54 38 - 126 U/L  ?  Total Bilirubin 0.9 0.3 - 1.2 mg/dL  ?  GFR, Estimated >60 >60 mL/min  ?    Comment: (NOTE) ?Calculated using the CKD-EPI Creatinine Equation (2021) ?   ?  Anion gap 9 5 - 15  ?    Comment: Performed at [facility], [address], [place], [place] [address]  ?Lipase, blood     Status: None  ?  Collection Time: 01/05/22  5:25 AM  ?Result Value Ref Range  ?  Lipase 31 11 - 51 U/L  ?    Comment: Performed at [facility], [address], [place], [place] [address]  ?Troponin I (High Sensitivity)     Status: None  ?  Collection Time: 01/05/22  5:25 AM  ?Result Value Ref Range  ?  Troponin I (High Sensitivity) 4 <18 ng/L  ?    Comment: (NOTE) ?Elevated high sensitivity troponin I (hsTnI) values and significant  ?changes across serial measurements may suggest ACS but many other  ?chronic and acute conditions are known to elevate hsTnI results.  ?Refer to the "Links" section for chest pain algorithms and additional  ?guidance. ?Performed at [facility], [address] [place], [place] [address] ?   ?Urinalysis, Routine w reflex microscopic Urine, Clean Catch     Status: Abnormal  ?  Collection Time: 01/05/22  8:16 AM  ?Result Value Ref Range  ?  Color, Urine YELLOW YELLOW  ?  APPearance CLEAR CLEAR  ?  Specific Gravity, Urine 1.010 1.005 - 1.030  ?  pH 5.5 5.0 - 8.0  ?  Glucose, UA  NEGATIVE NEGATIVE mg/dL  ?  Hgb urine dipstick TRACE (A) NEGATIVE  ?  Bilirubin Urine NEGATIVE NEGATIVE  ?  Ketones, ur NEGATIVE NEGATIVE mg/dL  ?  Protein, ur NEGATIVE NEGATIVE mg/dL  ?  Nitrite NEGATIVE N

## 2022-01-23 ENCOUNTER — Other Ambulatory Visit: Payer: Self-pay

## 2022-01-23 ENCOUNTER — Encounter (HOSPITAL_COMMUNITY): Payer: Self-pay

## 2022-01-23 ENCOUNTER — Encounter (HOSPITAL_COMMUNITY)
Admission: RE | Admit: 2022-01-23 | Discharge: 2022-01-23 | Disposition: A | Discharge status: Home / Self Care | Payer: Medicare Other | Source: Ambulatory Visit | Attending: Surgery | Admitting: Surgery

## 2022-01-23 HISTORY — DX: Personal history of urinary calculi: Z87.442

## 2022-01-23 NOTE — Progress Notes (Signed)
?   01/23/22 0850  ?OBSTRUCTIVE SLEEP APNEA  ?Have you ever been diagnosed with sleep apnea through a sleep study? No  ?Do you snore loudly (loud enough to be heard through closed doors)?  0  ?Do you often feel tired, fatigued, or sleepy during the daytime (such as falling asleep during driving or talking to someone)? 0  ?Has anyone observed you stop breathing during your sleep? 0  ?Do you have, or are you being treated for high blood pressure? 1  ?BMI more than 35 kg/m2? 1  ?Age > 58 (1-yes) 1  ?Neck circumference greater than:Male 16 inches or larger, Male 17inches or larger? 0  ?Male Gender (Yes=1) 1  ?Obstructive Sleep Apnea Score 4  ?Score 5 or greater  Results sent to PCP  ? ? ?

## 2022-01-25 ENCOUNTER — Ambulatory Visit (HOSPITAL_BASED_OUTPATIENT_CLINIC_OR_DEPARTMENT_OTHER): Payer: Medicare Other | Admitting: Anesthesiology

## 2022-01-25 ENCOUNTER — Ambulatory Visit (HOSPITAL_COMMUNITY)
Admission: RE | Admit: 2022-01-25 | Discharge: 2022-01-25 | Disposition: A | Discharge status: Home / Self Care | Payer: Medicare Other | Attending: Surgery | Admitting: Surgery

## 2022-01-25 ENCOUNTER — Ambulatory Visit (HOSPITAL_COMMUNITY): Payer: Medicare Other | Admitting: Anesthesiology

## 2022-01-25 ENCOUNTER — Other Ambulatory Visit: Payer: Self-pay

## 2022-01-25 ENCOUNTER — Encounter (HOSPITAL_COMMUNITY)
Admission: RE | Disposition: A | Discharge status: Home / Self Care | Payer: Self-pay | Source: Home / Self Care | Attending: Surgery

## 2022-01-25 ENCOUNTER — Encounter (HOSPITAL_COMMUNITY): Payer: Self-pay | Admitting: Surgery

## 2022-01-25 DIAGNOSIS — K8000 Calculus of gallbladder with acute cholecystitis without obstruction: Secondary | ICD-10-CM

## 2022-01-25 DIAGNOSIS — K8010 Calculus of gallbladder with chronic cholecystitis without obstruction: Secondary | ICD-10-CM | POA: Insufficient documentation

## 2022-01-25 DIAGNOSIS — E785 Hyperlipidemia, unspecified: Secondary | ICD-10-CM | POA: Diagnosis not present

## 2022-01-25 DIAGNOSIS — I700 Atherosclerosis of aorta: Secondary | ICD-10-CM | POA: Insufficient documentation

## 2022-01-25 DIAGNOSIS — N200 Calculus of kidney: Secondary | ICD-10-CM | POA: Diagnosis not present

## 2022-01-25 DIAGNOSIS — I10 Essential (primary) hypertension: Secondary | ICD-10-CM | POA: Insufficient documentation

## 2022-01-25 HISTORY — PX: CHOLECYSTECTOMY: SHX55

## 2022-01-25 SURGERY — LAPAROSCOPIC CHOLECYSTECTOMY
Anesthesia: General | Site: Abdomen

## 2022-01-25 MED ORDER — HYDROMORPHONE HCL 1 MG/ML IJ SOLN: 0.2500 mg | INTRAMUSCULAR | Status: DC | PRN

## 2022-01-25 MED ORDER — OXYCODONE HCL 5 MG PO TABS
ORAL_TABLET | ORAL | Status: AC
  Filled 2022-01-25: qty 1

## 2022-01-25 MED ORDER — ESMOLOL HCL 100 MG/10ML IV SOLN
INTRAVENOUS | Status: AC
  Filled 2022-01-25: qty 20

## 2022-01-25 MED ORDER — LIDOCAINE HCL (PF) 2 % IJ SOLN
INTRAMUSCULAR | Status: AC
  Filled 2022-01-25: qty 5

## 2022-01-25 MED ORDER — LACTATED RINGERS IV SOLN
INTRAVENOUS | Status: DC
  Administered 2022-01-25: 1000 mL via INTRAVENOUS

## 2022-01-25 MED ORDER — ROCURONIUM BROMIDE 10 MG/ML (PF) SYRINGE
PREFILLED_SYRINGE | INTRAVENOUS | Status: AC
  Filled 2022-01-25: qty 10

## 2022-01-25 MED ORDER — FENTANYL CITRATE (PF) 250 MCG/5ML IJ SOLN
INTRAMUSCULAR | Status: AC
  Filled 2022-01-25: qty 5

## 2022-01-25 MED ORDER — SUGAMMADEX SODIUM 200 MG/2ML IV SOLN
INTRAVENOUS | Status: DC | PRN
  Administered 2022-01-25: 200 mg via INTRAVENOUS

## 2022-01-25 MED ORDER — CHLORHEXIDINE GLUCONATE CLOTH 2 % EX PADS: 6.0000 | MEDICATED_PAD | Freq: Once | CUTANEOUS | Status: DC

## 2022-01-25 MED ORDER — MIDAZOLAM HCL 2 MG/2ML IJ SOLN
INTRAMUSCULAR | Status: DC | PRN
  Administered 2022-01-25: 1 mg via INTRAVENOUS

## 2022-01-25 MED ORDER — STERILE WATER FOR IRRIGATION IR SOLN
Status: DC | PRN
  Administered 2022-01-25: 500 mL

## 2022-01-25 MED ORDER — AMOXICILLIN-POT CLAVULANATE 875-125 MG PO TABS: 1.0000 | ORAL_TABLET | Freq: Two times a day (BID) | ORAL | 0 refills | Status: AC

## 2022-01-25 MED ORDER — PHENYLEPHRINE 40 MCG/ML (10ML) SYRINGE FOR IV PUSH (FOR BLOOD PRESSURE SUPPORT)
PREFILLED_SYRINGE | INTRAVENOUS | Status: DC | PRN
  Administered 2022-01-25 (×6): 40 ug via INTRAVENOUS

## 2022-01-25 MED ORDER — FENTANYL CITRATE (PF) 250 MCG/5ML IJ SOLN
INTRAMUSCULAR | Status: DC | PRN
Start: 2022-01-25 — End: 2022-01-25
  Administered 2022-01-25: 50 ug via INTRAVENOUS
  Administered 2022-01-25 (×2): 100 ug via INTRAVENOUS

## 2022-01-25 MED ORDER — BUPIVACAINE HCL (PF) 0.5 % IJ SOLN
INTRAMUSCULAR | Status: AC
  Filled 2022-01-25: qty 30

## 2022-01-25 MED ORDER — LIDOCAINE 2% (20 MG/ML) 5 ML SYRINGE
INTRAMUSCULAR | Status: DC | PRN
  Administered 2022-01-25: 80 mg via INTRAVENOUS

## 2022-01-25 MED ORDER — SODIUM CHLORIDE 0.9 % IV SOLN
2.0000 g | INTRAVENOUS | Status: AC
  Administered 2022-01-25: 2 g via INTRAVENOUS
  Filled 2022-01-25: qty 2

## 2022-01-25 MED ORDER — CHLORHEXIDINE GLUCONATE 0.12 % MT SOLN
15.0000 mL | Freq: Once | OROMUCOSAL | Status: AC
  Administered 2022-01-25: 15 mL via OROMUCOSAL

## 2022-01-25 MED ORDER — SODIUM CHLORIDE 0.9 % IR SOLN
Status: DC | PRN
  Administered 2022-01-25: 3000 mL

## 2022-01-25 MED ORDER — ACETAMINOPHEN 500 MG PO TABS: 1000.0000 mg | ORAL_TABLET | Freq: Four times a day (QID) | ORAL | 0 refills | Status: AC

## 2022-01-25 MED ORDER — ONDANSETRON HCL 4 MG/2ML IJ SOLN
INTRAMUSCULAR | Status: DC | PRN
  Administered 2022-01-25: 4 mg via INTRAVENOUS

## 2022-01-25 MED ORDER — MEPERIDINE HCL 50 MG/ML IJ SOLN: 6.2500 mg | INTRAMUSCULAR | Status: DC | PRN

## 2022-01-25 MED ORDER — ONDANSETRON HCL 4 MG/2ML IJ SOLN
INTRAMUSCULAR | Status: AC
  Filled 2022-01-25: qty 2

## 2022-01-25 MED ORDER — OXYCODONE HCL 5 MG PO TABS
5.0000 mg | ORAL_TABLET | Freq: Once | ORAL | Status: AC
  Administered 2022-01-25: 5 mg via ORAL

## 2022-01-25 MED ORDER — OXYCODONE HCL 5 MG PO TABS: 5.0000 mg | ORAL_TABLET | Freq: Four times a day (QID) | ORAL | 0 refills | Status: AC | PRN

## 2022-01-25 MED ORDER — PROPOFOL 10 MG/ML IV BOLUS
INTRAVENOUS | Status: DC | PRN
  Administered 2022-01-25: 30 mg via INTRAVENOUS
  Administered 2022-01-25: 150 mg via INTRAVENOUS

## 2022-01-25 MED ORDER — ORAL CARE MOUTH RINSE: 15.0000 mL | Freq: Once | OROMUCOSAL | Status: AC

## 2022-01-25 MED ORDER — HEMOSTATIC AGENTS (NO CHARGE) OPTIME
TOPICAL | Status: DC | PRN
Start: 2022-01-25 — End: 2022-01-25
  Administered 2022-01-25 (×3): 1 via TOPICAL

## 2022-01-25 MED ORDER — MIDAZOLAM HCL 2 MG/2ML IJ SOLN
INTRAMUSCULAR | Status: AC
  Filled 2022-01-25: qty 2

## 2022-01-25 MED ORDER — DEXAMETHASONE SODIUM PHOSPHATE 4 MG/ML IJ SOLN
INTRAMUSCULAR | Status: DC | PRN
  Administered 2022-01-25: 5 mg via INTRAVENOUS

## 2022-01-25 MED ORDER — DOCUSATE SODIUM 100 MG PO CAPS: 100.0000 mg | ORAL_CAPSULE | Freq: Two times a day (BID) | ORAL | 2 refills | Status: DC

## 2022-01-25 MED ORDER — SODIUM CHLORIDE 0.9 % IR SOLN
Status: DC | PRN
  Administered 2022-01-25: 1000 mL

## 2022-01-25 MED ORDER — KETOROLAC TROMETHAMINE 30 MG/ML IJ SOLN
INTRAMUSCULAR | Status: AC
  Filled 2022-01-25: qty 1

## 2022-01-25 MED ORDER — BUPIVACAINE HCL (PF) 0.5 % IJ SOLN
INTRAMUSCULAR | Status: DC | PRN
  Administered 2022-01-25: 30 mL

## 2022-01-25 MED ORDER — PROPOFOL 10 MG/ML IV BOLUS
INTRAVENOUS | Status: AC
  Filled 2022-01-25: qty 20

## 2022-01-25 MED ORDER — ROCURONIUM BROMIDE 10 MG/ML (PF) SYRINGE
PREFILLED_SYRINGE | INTRAVENOUS | Status: DC | PRN
  Administered 2022-01-25: 60 mg via INTRAVENOUS
  Administered 2022-01-25 (×3): 10 mg via INTRAVENOUS

## 2022-01-25 MED ORDER — ONDANSETRON HCL 4 MG/2ML IJ SOLN
4.0000 mg | Freq: Once | INTRAMUSCULAR | Status: AC | PRN
  Administered 2022-01-25: 4 mg via INTRAVENOUS

## 2022-01-25 SURGICAL SUPPLY — 56 items
ADH SKN CLS APL DERMABOND .7 (GAUZE/BANDAGES/DRESSINGS) ×1
APL ESCP 73.6OZ SRGCL (TIP) ×1
APL PRP STRL LF DISP 70% ISPRP (MISCELLANEOUS) ×1
APPLIER CLIP ROT 10 11.4 M/L (STAPLE) ×2
APR CLP MED LRG 11.4X10 (STAPLE) ×1
BAG RETRIEVAL 10 (BASKET) ×1
BLADE SURG 15 STRL LF DISP TIS (BLADE) ×1 IMPLANT
BLADE SURG 15 STRL SS (BLADE) ×2
CHLORAPREP W/TINT 26 (MISCELLANEOUS) ×2 IMPLANT
CLIP APPLIE ROT 10 11.4 M/L (STAPLE) ×1 IMPLANT
CLOTH BEACON ORANGE TIMEOUT ST (SAFETY) ×2 IMPLANT
COVER LIGHT HANDLE STERIS (MISCELLANEOUS) ×4 IMPLANT
CUTTER FLEX LINEAR 45M (STAPLE) ×1 IMPLANT
DECANTER SPIKE VIAL GLASS SM (MISCELLANEOUS) ×2 IMPLANT
DERMABOND ADVANCED (GAUZE/BANDAGES/DRESSINGS) ×1
DERMABOND ADVANCED .7 DNX12 (GAUZE/BANDAGES/DRESSINGS) ×1 IMPLANT
DISSECTOR BLUNT TIP ENDO 5MM (MISCELLANEOUS) ×3 IMPLANT
ELECT REM PT RETURN 9FT ADLT (ELECTROSURGICAL) ×2
ELECTRODE REM PT RTRN 9FT ADLT (ELECTROSURGICAL) ×1 IMPLANT
GAUZE 4X4 16PLY ~~LOC~~+RFID DBL (SPONGE) ×1 IMPLANT
GLOVE BIO SURGEON STRL SZ 6.5 (GLOVE) ×1 IMPLANT
GLOVE BIOGEL PI IND STRL 6.5 (GLOVE) IMPLANT
GLOVE BIOGEL PI IND STRL 7.0 (GLOVE) IMPLANT
GLOVE BIOGEL PI INDICATOR 6.5 (GLOVE) ×1
GLOVE BIOGEL PI INDICATOR 7.0 (GLOVE) ×2
GLOVE SURG ENC MOIS LTX SZ6.5 (GLOVE) ×2 IMPLANT
GLOVE SURG SS PI 6.5 STRL IVOR (GLOVE) ×1 IMPLANT
GLOVE SURG SS PI 7.5 STRL IVOR (GLOVE) ×1 IMPLANT
GOWN STRL REUS W/TWL LRG LVL3 (GOWN DISPOSABLE) ×6 IMPLANT
HEMOSTAT SNOW SURGICEL 2X4 (HEMOSTASIS) ×3 IMPLANT
INST SET LAPROSCOPIC AP (KITS) ×2 IMPLANT
IV NS IRRIG 3000ML ARTHROMATIC (IV SOLUTION) ×1 IMPLANT
KIT TURNOVER KIT A (KITS) ×2 IMPLANT
MANIFOLD NEPTUNE II (INSTRUMENTS) ×2 IMPLANT
NDL INSUFFLATION 14GA 120MM (NEEDLE) ×1 IMPLANT
NEEDLE INSUFFLATION 14GA 120MM (NEEDLE) ×2 IMPLANT
NS IRRIG 1000ML POUR BTL (IV SOLUTION) ×2 IMPLANT
PACK LAP CHOLE LZT030E (CUSTOM PROCEDURE TRAY) ×2 IMPLANT
PAD ARMBOARD 7.5X6 YLW CONV (MISCELLANEOUS) ×2 IMPLANT
PENCIL HANDSWITCHING (ELECTRODE) ×1 IMPLANT
POWDER SURGICEL 3.0 GRAM (HEMOSTASIS) ×1 IMPLANT
SET BASIN LINEN APH (SET/KITS/TRAYS/PACK) ×2 IMPLANT
SET TUBE IRRIG SUCTION NO TIP (IRRIGATION / IRRIGATOR) ×1 IMPLANT
SET TUBE SMOKE EVAC HIGH FLOW (TUBING) ×2 IMPLANT
SLEEVE ENDOPATH XCEL 5M (ENDOMECHANICALS) ×2 IMPLANT
SUT MNCRL AB 4-0 PS2 18 (SUTURE) ×4 IMPLANT
SUT VICRYL 0 UR6 27IN ABS (SUTURE) ×2 IMPLANT
SYS BAG RETRIEVAL 10MM (BASKET) ×1
SYSTEM BAG RETRIEVAL 10MM (BASKET) ×1 IMPLANT
TIP ENDOSCOPIC SURGICEL (TIP) ×1 IMPLANT
TROCAR ENDO BLADELESS 11MM (ENDOMECHANICALS) ×2 IMPLANT
TROCAR XCEL NON-BLD 5MMX100MML (ENDOMECHANICALS) ×2 IMPLANT
TROCAR XCEL UNIV SLVE 11M 100M (ENDOMECHANICALS) ×2 IMPLANT
TUBE CONNECTING 12X1/4 (SUCTIONS) ×2 IMPLANT
WARMER LAPAROSCOPE (MISCELLANEOUS) ×2 IMPLANT
WATER STERILE IRR 500ML POUR (IV SOLUTION) ×1 IMPLANT

## 2022-01-25 NOTE — Anesthesia Preprocedure Evaluation (Addendum)
Anesthesia Evaluation  ?Patient identified by MRN, date of birth, ID band ?Patient awake ? ? ? ?Reviewed: ?Allergy & Precautions, NPO status , Patient's Chart, lab work & pertinent test results, reviewed documented beta blocker date and time  ? ?Airway ?Mallampati: III ? ?TM Distance: >3 FB ?Neck ROM: Full ? ? ? Dental ? ?(+) Dental Advisory Given, Missing ?  ?Pulmonary ?asthma , sleep apnea (snoring ) ,  ?  ?Pulmonary exam normal ?breath sounds clear to auscultation ? ? ? ? ? ? Cardiovascular ?Exercise Tolerance: Good ?hypertension, Pt. on medications and Pt. on home beta blockers ?Normal cardiovascular exam+ dysrhythmias (tachycardia)  ?Rhythm:Regular Rate:Normal ? ? ?  ?Neuro/Psych ? Neuromuscular disease negative psych ROS  ? GI/Hepatic ?negative GI ROS, Neg liver ROS,   ?Endo/Other  ?negative endocrine ROS ? Renal/GU ?negative Renal ROS  ?negative genitourinary ?  ?Musculoskeletal ?negative musculoskeletal ROS ?(+)  ? Abdominal ?  ?Peds ?negative pediatric ROS ?(+)  Hematology ?negative hematology ROS ?(+)   ?Anesthesia Other Findings ? ? Reproductive/Obstetrics ?negative OB ROS ? ?  ? ? ? ? ? ? ? ? ? ? ? ? ? ?  ?  ? ? ? ? ?Anesthesia Physical ?Anesthesia Plan ? ?ASA: 2 ? ?Anesthesia Plan: General  ? ?Post-op Pain Management: Dilaudid IV  ? ?Induction: Intravenous ? ?PONV Risk Score and Plan: 4 or greater and Ondansetron and Dexamethasone ? ?Airway Management Planned: Oral ETT ? ?Additional Equipment:  ? ?Intra-op Plan:  ? ?Post-operative Plan: Extubation in OR ? ?Informed Consent: I have reviewed the patients History and Physical, chart, labs and discussed the procedure including the risks, benefits and alternatives for the proposed anesthesia with the patient or authorized representative who has indicated his/her understanding and acceptance.  ? ? ? ?Dental advisory given ? ?Plan Discussed with: CRNA and Surgeon ? ?Anesthesia Plan Comments:   ? ? ? ? ?Anesthesia Quick  Evaluation ? ?

## 2022-01-25 NOTE — Interval H&P Note (Signed)
History and Physical Interval Note: ? ?01/25/2022 ?8:57 AM ? ?Charles Moore  has presented today for surgery, with the diagnosis of CHOLELITHASIS.  The various methods of treatment have been discussed with the patient and family. After consideration of risks, benefits and other options for treatment, the patient has consented to  Procedure(s): ?LAPAROSCOPIC CHOLECYSTECTOMY (N/A) as a surgical intervention.  The patient's history has been reviewed, patient examined, no change in status, stable for surgery.  I have reviewed the patient's chart and labs.  Questions were answered to the patient's satisfaction.   ? ? ?Kamau Weatherall A Elford Evilsizer ? ? ?

## 2022-01-25 NOTE — Anesthesia Procedure Notes (Signed)
Procedure Name: Intubation ?Date/Time: 01/25/2022 9:18 AM ?Performed by: Vista Deck, CRNA ?Pre-anesthesia Checklist: Patient identified, Patient being monitored, Timeout performed, Emergency Drugs available and Suction available ?Patient Re-evaluated:Patient Re-evaluated prior to induction ?Oxygen Delivery Method: Circle system utilized ?Preoxygenation: Pre-oxygenation with 100% oxygen ?Induction Type: IV induction ?Ventilation: Mask ventilation without difficulty ?Laryngoscope Size: Mac and 4 ?Grade View: Grade II ?Tube type: Oral ?Tube size: 7.5 mm ?Number of attempts: 1 ?Airway Equipment and Method: Stylet ?Placement Confirmation: ETT inserted through vocal cords under direct vision, positive ETCO2 and breath sounds checked- equal and bilateral ?Secured at: 23 cm ?Tube secured with: Tape ?Dental Injury: Teeth and Oropharynx as per pre-operative assessment  ? ? ? ? ?

## 2022-01-25 NOTE — Progress Notes (Signed)
Update Note: ? ?Patient's wife, Charles Moore, was updated in the consultation room.  It was explained that he did well during the surgery, and his gallbladder was removed.  I further explained that his gallbladder was acutely inflamed.  For that reason, I will be sending him home with 4 days of antibiotics to take.  He will also be discharged with roxicodone, scheduled Tylenol, and colace.  He has dissolvable sutures under the skin and skin glue overlying his incisions that will flake off in 10-14 days.  I advised his family that if he begins to experience pain like the pain that resulted in him presenting to the ED, fever, chills, N/V, and jaundice, then he needs to come back to the hospital for evaluation.  He will follow up with me in 2 weeks.  All questions were answered to their expressed satisfaction. ? ?Graciella Freer, DO ?[facility] ?[place],  [address] ?[phone] (office) ? ?

## 2022-01-25 NOTE — Discharge Instructions (Signed)
Ambulatory Surgery Discharge Instructions  General Anesthesia or Sedation Do not drive or operate heavy machinery for 24 hours.  Do not consume alcohol, tranquilizers, sleeping medications, or any non-prescribed medications for 24 hours. Do not make important decisions or sign any important papers in the next 24 hours. You should have someone with you tonight at home.  Activity  You are advised to go directly home from the hospital.  Restrict your activities and rest for a day.  Resume light activity tomorrow. No heavy lifting over 10 lbs or strenuous exercise.  Fluids and Diet Begin with clear liquids, bouillon, dry toast, soda crackers.  If not nauseated, you may go to a regular diet when you desire.  Greasy and spicy foods are not advised.  Medications  If you have not had a bowel movement in 24 hours, take 2 tablespoons over the counter Milk of mag.             You May resume your blood thinners tomorrow (Aspirin, coumadin, or other).  You are being discharged with prescriptions for Opioid/Narcotic Medications: There are some specific considerations for these medications that you should know. Opioid Meds have risks & benefits. Addiction to these meds is always a concern with prolonged use Take medication only as directed Do not drive while taking narcotic pain medication Do not crush tablets or capsules Do not use a different container than medication was dispensed in Lock the container of medication in a cool, dry place out of reach of children and pets. Opioid medication can cause addiction Do not share with anyone else (this is a felony) Do not store medications for future use. Dispose of them properly.     Disposal:  Find a [place] household drug take back site near you.  If you can't get to a drug take back site, use the recipe below as a last resort to dispose of expired, unused or unwanted drugs. Disposal  (Do not dispose chemotherapy drugs this way, talk to your  prescribing doctor instead.) Step 1: Mix drugs (do not crush) with dirt, kitty litter, or used coffee grounds and add a small amount of water to dissolve any solid medications. Step 2: Seal drugs in plastic bag. Step 3: Place plastic bag in trash. Step 4: Take prescription container and scratch out personal information, then recycle or throw away.  Operative Site  You have a liquid bandage over your incisions, this will begin to flake off in about a week. Ok to shower tomorrow. Keep wound clean and dry. No baths or swimming. No lifting more than 10 pounds.  Contact Information: If you have questions or concerns, please call our office, [phone], Monday- Thursday 8AM-5PM and Friday 8AM-12Noon.  If it is after hours or on the weekend, please call [facility] Main Number, [phone], and ask to speak to the surgeon on call for Dr. Elmar Antigua at [place].   SPECIFIC COMPLICATIONS TO WATCH FOR: Inability to urinate Fever over 101? F by mouth Nausea and vomiting lasting longer than 24 hours. Pain not relieved by medication ordered Swelling around the operative site Increased redness, warmth, hardness, around operative area Numbness, tingling, or cold fingers or toes Blood -soaked dressing, (small amounts of oozing may be normal) Increasing and progressive drainage from surgical area or exam site  

## 2022-01-25 NOTE — Anesthesia Postprocedure Evaluation (Signed)
Anesthesia Post Note ? ?Patient: KAELUM KISSICK ? ?Procedure(s) Performed: LAPAROSCOPIC CHOLECYSTECTOMY (Abdomen) ? ?Patient location during evaluation: Phase II ?Anesthesia Type: General ?Level of consciousness: awake and alert and oriented ?Pain management: pain level controlled ?Vital Signs Assessment: post-procedure vital signs reviewed and stable ?Respiratory status: spontaneous breathing, nonlabored ventilation and respiratory function stable ?Cardiovascular status: blood pressure returned to baseline and stable ?Postop Assessment: no apparent nausea or vomiting ?Anesthetic complications: no ? ? ?No notable events documented. ? ? ?Last Vitals:  ?Vitals:  ? 01/25/22 1330 01/25/22 1345  ?BP: 117/68 121/63  ?Pulse: 81 79  ?Resp: 14 (!) 9  ?Temp:    ?SpO2: 98% 96%  ?  ?Last Pain:  ?Vitals:  ? 01/25/22 1345  ?TempSrc:   ?PainSc: 5   ? ? ?  ?  ?  ?  ?  ?  ? ?Acy Orsak C Kenishia Plack ? ? ? ? ?

## 2022-01-25 NOTE — Transfer of Care (Signed)
Immediate Anesthesia Transfer of Care Note ? ?Patient: SUHAIL PELOQUIN ? ?Procedure(s) Performed: LAPAROSCOPIC CHOLECYSTECTOMY (Abdomen) ? ?Patient Location: PACU ? ?Anesthesia Type:General ? ?Level of Consciousness: awake and patient cooperative ? ?Airway & Oxygen Therapy: Patient Spontanous Breathing and Patient connected to nasal cannula oxygen ? ?Post-op Assessment: Report given to RN and Post -op Vital signs reviewed and stable ? ?Post vital signs: Reviewed and stable ? ?Last Vitals:  ?Vitals Value Taken Time  ?BP 127/84 01/25/22 1237  ?Temp 98   ?Pulse 87 01/25/22 1239  ?Resp 13 01/25/22 1239  ?SpO2 99 % 01/25/22 1239  ?Vitals shown include unvalidated device data. ? ?Last Pain:  ?Vitals:  ? 01/25/22 0748  ?TempSrc: Oral  ?PainSc: 0-No pain  ?   ? ?Patients Stated Pain Goal: 8 (01/25/22 0748) ? ?Complications: No notable events documented. ?

## 2022-01-25 NOTE — Op Note (Signed)
Operative Note ?  ?Preoperative Diagnosis: Symptomatic cholelithiasis ?  ?Postoperative Diagnosis: Acute Cholecystitis, cholelithiasis ?  ?Procedure(s) Performed: Laparoscopic cholecystectomy ?  ?Surgeon: Graciella Freer, DO  ?  ?Assistants: Leeann Must, RN ?  ?Anesthesia: General endotracheal ?  ?Anesthesiologist: Denese Killings, MD  ?  ?Specimens: Gallbladder  ?  ?Estimated Blood Loss: Minimal  ?  ?Blood Replacement: None  ?  ?Complications: None  ? ?Indications: Patient is a 66 year old male who presents for laparoscopic cholecystectomy. He had one episode of epigastric/RUQ abdominal pain, and went to the ED, at which time they obtained a CT abdomen and pelvis, which demonstrated cholelithiasis.  He is agreeable to laparoscopic cholecystectomy. ? ?All risks, benefits, and alternatives to laparoscopic cholecystectomy were discussed with the patient and his family, all of their questions were answered to their expressed satisfaction. The patient expresses he wishes to proceed, and informed consent was obtained. ?  ?Operative Findings: Acute inflammation of the gallbladder with thick sludge and stones ?  ?Procedure: The patient was taken to the operating room and placed supine. General endotracheal anesthesia was induced. Intravenous antibiotics were administered per protocol. An orogastric tube positioned to decompress the stomach. The abdomen was prepared and draped in the usual sterile fashion.  ?  ?A supraumbilical incision was made and a Veress technique was utilized to achieve pneumoperitoneum to 15 mmHg with carbon dioxide. A 11 mm optiview port was placed through the supraumbilical region, and a 10 mm 0-degree operative laparoscope was introduced. The area underlying the trocar and Veress needle were inspected and without evidence of injury.  Remaining trocars were placed under direct vision. Two 5 mm ports were placed in the right abdomen, between the anterior axillary and midclavicular line.   A final 11 mm port was placed through the mid-epigastrium, near the falciform ligament.  ?  ?The gallbladder fundus was elevated cephalad and the infundibulum was retracted to the patient's right. The gallbladder/cystic duct junction was skeletonized. The cystic artery noted in the triangle of Calot and was also skeletonized.  We then continued liberal medial and lateral dissection until the critical view of safety was achieved. The cystic duct was noted to be redundant and folding over itself, but it was able to be fully dissected out to verify there was no overlying ductal structure. ?  ?The cystic duct was stapled with an endoGIA stapler. The cystic artery were doubly clipped and divided. The gallbladder was then dissected from the liver bed with electrocautery. The specimen was placed in an Endopouch and was retrieved through the epigastric site.  Gallstones that fell out of the gallbladder during dissection were removed from the abdomen. ?  ?Final inspection revealed acceptable hemostasis. Surgical SNOW and Surgicel Powder were placed in the gallbladder bed.  Trocars were removed and pneumoperitoneum was released.  0 Vicryl fascial sutures were used to close the epigastric and umbilical port sites. Skin incisions were closed with 4-0 Monocryl subcuticular sutures and Dermabond. The patient was awakened from anesthesia and extubated without complication.  ?  ?Graciella Freer, DO  ?[facility] ?[place], [place] [address] ?[phone] (office) ? ? ?

## 2022-01-28 ENCOUNTER — Telehealth: Payer: Self-pay | Admitting: *Deleted

## 2022-01-28 ENCOUNTER — Encounter (HOSPITAL_COMMUNITY): Payer: Self-pay | Admitting: Surgery

## 2022-01-28 LAB — SURGICAL PATHOLOGY

## 2022-01-28 NOTE — Telephone Encounter (Signed)
Received VM on 01/25/2022 from [facility] with [place] [phone] telephone.  ? ?Requested return call in regards to patient prescriptions.  ? ?Call placed to pharmacy and spoke with [facility]. States that she sees no notes on patient chart as to why call was requested.  ? ?States that patient was able to pick up all prescriptions except for Oxycodone S/P Lap Chole on 01/25/2022. ? ?Anderson Malta attempted to fill prescription and no issues noted. Patient will be made aware that prescription is available.  ?

## 2022-02-12 ENCOUNTER — Ambulatory Visit (INDEPENDENT_AMBULATORY_CARE_PROVIDER_SITE_OTHER): Payer: Medicare Other | Admitting: Surgery

## 2022-02-12 ENCOUNTER — Encounter: Payer: Self-pay | Admitting: Surgery

## 2022-02-12 VITALS — BP 116/80 | HR 103 | Temp 98.1°F | Resp 14 | Ht 69.0 in | Wt 261.0 lb

## 2022-02-12 DIAGNOSIS — Z09 Encounter for follow-up examination after completed treatment for conditions other than malignant neoplasm: Secondary | ICD-10-CM

## 2022-02-12 NOTE — Progress Notes (Addendum)
[facility] Note  ? ?HPI:  ?66 y.o. Male presents to clinic for post-op follow-up status post laparoscopic cholecystectomy on 4/14.  Patient states that for the first couple days after surgery, he had a fair amount of pain, but since then, he denies any abdominal pain.  He is tolerating his diet without nausea and vomiting.  He is moving his bowels without difficulty.  His incisions have been healing well, and skin glue is still in place.  He denies any drainage from his incision sites.  Denies fevers or chills.  Denies any yellow discoloration of his skin. ? ?Review of Systems:  ?All other review of systems: otherwise negative  ? ?Vital Signs:  ?BP 116/80   Pulse (!) 103   Temp 98.1 ?F (36.7 ?C) (Oral)   Resp 14   Ht '5\' 9"'$  (1.753 m)   Wt 261 lb (118.4 kg)   SpO2 94%   BMI 38.54 kg/m?   ? ?Physical Exam:  ?Physical Exam ?Vitals reviewed.  ?Constitutional:   ?   Appearance: Normal appearance.  ?Abdominal:  ?   Comments: Abdomen soft, nondistended, nontender to percussion and palpation; incision healing well with overlying skin glue  ?Neurological:  ?   Mental Status: He is alert.  ? ? ?Laboratory studies: None ? ?Imaging:  ?None ? ?Pathology: ?A. GALLBLADDER, CHOLECYSTECTOMY:  ?- Chronic cholecystitis and cholelithiasis  ? ?Assessment:  ?66 y.o. yo Male who presents for follow-up status post laparoscopic cholecystectomy on 4/14 ? ?Plan:  ?-Patient doing well since the surgery ?-Advised the patient to gently scrub his incision sites to help skin glue flake off ?-Follow up with me as needed ? ?All of the above recommendations were discussed with the patient and patient's family, and all of patient's and family's questions were answered to their expressed satisfaction. ? ?Graciella Freer, DO ?[facility] ?[place], [place] [address] ?[phone] (office) ?

## 2022-03-10 ENCOUNTER — Other Ambulatory Visit: Payer: Self-pay | Admitting: Medical

## 2022-03-25 ENCOUNTER — Telehealth: Payer: Self-pay | Admitting: Medical

## 2022-03-25 ENCOUNTER — Other Ambulatory Visit: Payer: Self-pay | Admitting: Medical

## 2022-03-25 MED ORDER — LISINOPRIL-HYDROCHLOROTHIAZIDE 20-25 MG PO TABS: 1.0000 | ORAL_TABLET | Freq: Every day | ORAL | 0 refills | Status: DC

## 2022-03-25 NOTE — Telephone Encounter (Signed)
done

## 2022-03-25 NOTE — Telephone Encounter (Signed)
Pt needs refill on lisinopril-hctz  [employer]

## 2022-04-23 ENCOUNTER — Other Ambulatory Visit: Payer: Self-pay | Admitting: Urology

## 2022-04-23 DIAGNOSIS — C61 Malignant neoplasm of prostate: Secondary | ICD-10-CM

## 2022-04-25 ENCOUNTER — Telehealth: Payer: Self-pay | Admitting: Medical

## 2022-04-25 ENCOUNTER — Encounter: Payer: Self-pay | Admitting: Medical

## 2022-04-25 ENCOUNTER — Ambulatory Visit (INDEPENDENT_AMBULATORY_CARE_PROVIDER_SITE_OTHER): Payer: Medicare Other | Admitting: Medical

## 2022-04-25 VITALS — BP 140/100 | HR 94 | Wt 267.4 lb

## 2022-04-25 DIAGNOSIS — R42 Dizziness and giddiness: Secondary | ICD-10-CM | POA: Diagnosis not present

## 2022-04-25 DIAGNOSIS — E785 Hyperlipidemia, unspecified: Secondary | ICD-10-CM

## 2022-04-25 DIAGNOSIS — I10 Essential (primary) hypertension: Secondary | ICD-10-CM

## 2022-04-25 LAB — BASIC METABOLIC PANEL
BUN/Creatinine Ratio: 12 (ref 10–24)
BUN: 14 mg/dL (ref 8–27)
CO2: 28 mmol/L (ref 20–29)
Calcium: 10 mg/dL (ref 8.6–10.2)
Chloride: 102 mmol/L (ref 96–106)
Creatinine, Ser: 1.19 mg/dL (ref 0.76–1.27)
Glucose: 106 mg/dL — ABNORMAL HIGH (ref 70–99)
Potassium: 3.8 mmol/L (ref 3.5–5.2)
Sodium: 138 mmol/L (ref 134–144)
eGFR: 68 mL/min/{1.73_m2} (ref >59)

## 2022-04-25 NOTE — Patient Instructions (Signed)
I suspect you got a little dehydrated over the last several days between working out in the hot sun for several hours, eating saltier foods over the course of a few days and getting dehydrated.  I recommend you take it easy and rest for the next few days.  Continue to hydrate with at least 80 ounces of water daily.  I am going to check a lab only today in case we need to change anything about your medications.  One of your blood pressure pills does have a diuretic.  Sometimes when you combine dehydration, excess salt and medication you may end up with electrolyte disturbance or even acute kidney injury.  We will call back with lab results today.  Limit salt, work on eating a low-salt healthy diet regular.  When you are out in the hot sun make sure you hydrate even better given that you are on a diuretic type medication.  Consider mowing or other outside work in the cooler parts of the day.  If any new symptoms over the next few days such as chest pain, palpitations, faint feeling, one-sided numbness weakness or tingling, then call 911.

## 2022-04-25 NOTE — Telephone Encounter (Signed)
Labs were ok.  Continue current medications but take the next few days to rest and drink at leaste 80 ounces of water or more daily  If not feeling better by Monday using the rest then let me know, call back Monday.  For example if hydrated well by Monday but still feeling dizzy then we may need to do some other testing such as head scan.  If worse over the weekend such as significant dizziness, one-sided weakness numbness or tingling, confusion, slurred speech or anything like that call 911.  Likewise if any chest pain or palpitations or significant leg swelling then call 911 or go to the hospital.  Please await call back sometime next week let me know if improving

## 2022-04-25 NOTE — Progress Notes (Signed)
Subjective:  Charles Moore is a 66 y.o. male who presents for Chief Complaint  Patient presents with   Acute Visit    Pt stated that he has been very light headed and dizzy.     Here for BP concerns.   He gorged on fried chicken recently and pork chops for his anniversary.  The day before Sunday on 04/21/22, had a feast he cut the grass in the hot sun, felt bad after after mowing.   Later that day was at the store, and was walking around in the hot sun trying to find his car in the parking lot.   Got upset and agitated.   Thinks he had too much salt over the weekend.    Yesterday and Tuesday felt better, but has had some dizziness at times for seconds.   Having some vertigo, room spinning sensation the last few days.  He note compliance with Lisinopril HCT 20/'25mg'$  and Toprol XL '50mg'$  daily.   Compliant with cholesterol medication as well.  He had not been checking BPs at home for a while until the last few days feeling bad . Last night BP was good, few days ago it was elevated a little .   He denies chest pain, dyspnea, no leg swelling, no slurred speech, no hearing change, but a day ago had some slight disturbance where his vision seemed a little off but not blurred.  No numbness, tingling, weakness.   No syncope.   No palpitations.    No other aggravating or relieving factors.    No other c/o.  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 90 tablet 3   lisinopril-hydrochlorothiazide (ZESTORETIC) 20-25 MG tablet Take 1 tablet by mouth daily. 90 tablet 0   metoprolol succinate (TOPROL-XL) 50 MG 24 hr tablet TAKE ONE TABLET BY MOUTH DAILY WITH OR  IMMEDIATELY FOLLOWING A MEAL 90 tablet 1   Multiple Vitamins-Minerals (MULTIVITAMIN WITH MINERALS) tablet Take 1 tablet by mouth daily.     ondansetron (ZOFRAN-ODT) 8 MG disintegrating tablet Take 1 tablet (8 mg total) by mouth every 8 (eight) hours as needed for nausea or vomiting. 10 tablet 0   rosuvastatin (CRESTOR) 20 MG tablet TAKE ONE TABLET BY MOUTH DAILY 90 tablet 0   docusate sodium (COLACE) 100 MG capsule Take 1 capsule (100 mg total) by mouth 2 (two) times daily. (Patient not taking: Reported on 04/25/2022) 60 capsule 2   L-Arginine 500 MG CAPS Take 500 mg by mouth daily. (Patient not taking: Reported on 04/25/2022)     No current facility-administered medications on file prior to visit.     The following portions of the patient's history were reviewed and updated as appropriate: allergies, current medications, past family history, past medical history, past social history, past surgical history and problem list.  ROS Otherwise as in subjective above    Objective: BP (!) 140/100   Pulse 94   Wt 267 lb 6.4 oz (121.3 kg)   SpO2 98%   BMI 39.49 kg/m   Wt Readings from Last 3 Encounters:  04/25/22 267 lb 6.4 oz (121.3 kg)  02/12/22 261 lb (118.4 kg)  01/25/22 263 lb 14.3 oz (119.7 kg)   BP Readings from Last 3 Encounters:  04/25/22 (!) 140/100  02/12/22 116/80  01/25/22 127/72   General appearance: alert, no distress, well developed, well nourished HEENT: normocephalic, sclerae anicteric, conjunctiva pink and moist, TMs pearly, nares patent, no discharge or erythema, pharynx normal Oral cavity: MMM, no lesions Neck: supple, no lymphadenopathy, no thyromegaly, no masses, no bruits or JVD Heart: RRR, normal S1, S2, no murmurs Lungs: CTA bilaterally, no wheezes, rhonchi, or rales Pulses: 2+ radial pulses, 2+ pedal pulses, normal cap refill Ext: no edema Neuro: Alert and oriented x3, CN II through XII intact, nonfocal exam   EKG reviewed today   Echocardiogram  06/07/21: IMPRESSIONS    1. Left ventricular ejection fraction, by estimation, is 65 to 70%. The  left ventricle has normal function. The left ventricle has no regional  wall motion abnormalities. Left ventricular diastolic parameters are  indeterminate.   2. Right ventricular systolic function is normal. The right ventricular  size is normal.   3. The mitral valve is normal in structure. No evidence of mitral valve  regurgitation.   4. The aortic valve is normal in structure. Aortic valve regurgitation is  not visualized. No aortic stenosis is present.   5. Aortic dilatation noted. There is mild dilatation of the ascending  aorta, measuring 40 mm.    Assessment: Encounter Diagnoses  Name Primary?   Dizziness Yes   Essential hypertension    Dyslipidemia      Plan: I suspect you got a little dehydrated over the last several days between working out in the hot sun for several hours, eating saltier foods over the course of a few days and getting dehydrated.  I recommend you take it easy and rest for the next few days.  Continue to hydrate with at least 80 ounces of water daily.  I am going to check a lab only today in case we need to change anything about your medications.  One of your blood pressure pills does have a diuretic.  Sometimes when you combine dehydration, excess salt and medication you may end up with electrolyte disturbance or even acute kidney injury.  We will call back with lab results today.  Limit salt, work on eating a low-salt healthy diet regular.  When you are out in the hot sun make sure you hydrate even better given that you are on a diuretic type medication.  Consider mowing or other outside work in the cooler parts of the day.  If any new symptoms over the next few days such as chest pain, palpitations, faint feeling, one-sided numbness weakness or tingling, then call 911.   Charles Moore was seen today for acute visit.  Diagnoses and all orders for this  visit:  Dizziness -     Orthostatic vital signs -     EKG 12-Lead -     Cancel: Basic metabolic panel -     Basic metabolic panel  Essential hypertension -     Orthostatic vital signs -     EKG 12-Lead -     Cancel: Basic metabolic panel -     Basic metabolic panel  Dyslipidemia -     Orthostatic vital signs -     EKG 12-Lead -     Cancel: Basic metabolic panel -     Basic metabolic panel   Follow up: pending labs

## 2022-05-20 ENCOUNTER — Ambulatory Visit
Admission: RE | Admit: 2022-05-20 | Discharge: 2022-05-20 | Disposition: A | Discharge status: Home / Self Care | Payer: Medicare Other | Source: Ambulatory Visit | Attending: Urology | Admitting: Urology

## 2022-05-20 DIAGNOSIS — C61 Malignant neoplasm of prostate: Secondary | ICD-10-CM

## 2022-05-20 MED ORDER — GADOBENATE DIMEGLUMINE 529 MG/ML IV SOLN
20.0000 mL | Freq: Once | INTRAVENOUS | Status: AC | PRN
Start: 2022-05-20 — End: 2022-05-20
  Administered 2022-05-20: 20 mL via INTRAVENOUS

## 2022-05-21 ENCOUNTER — Other Ambulatory Visit: Payer: Medicare Other

## 2022-05-22 ENCOUNTER — Other Ambulatory Visit: Payer: Medicare Other

## 2022-05-22 DIAGNOSIS — C61 Malignant neoplasm of prostate: Secondary | ICD-10-CM

## 2022-05-23 LAB — PSA, TOTAL AND FREE
PSA, Free Pct: 17.8 %
PSA, Free: 0.89 ng/mL
Prostate Specific Ag, Serum: 5 ng/mL — ABNORMAL HIGH (ref 0.0–4.0)

## 2022-05-28 ENCOUNTER — Encounter: Payer: Self-pay | Admitting: Urology

## 2022-05-28 ENCOUNTER — Ambulatory Visit (INDEPENDENT_AMBULATORY_CARE_PROVIDER_SITE_OTHER): Payer: Medicare Other | Admitting: Urology

## 2022-05-28 VITALS — BP 119/75 | HR 89

## 2022-05-28 DIAGNOSIS — C61 Malignant neoplasm of prostate: Secondary | ICD-10-CM

## 2022-05-28 DIAGNOSIS — N400 Enlarged prostate without lower urinary tract symptoms: Secondary | ICD-10-CM

## 2022-05-28 DIAGNOSIS — R3912 Poor urinary stream: Secondary | ICD-10-CM

## 2022-05-28 DIAGNOSIS — R972 Elevated prostate specific antigen [PSA]: Secondary | ICD-10-CM

## 2022-05-28 MED ORDER — FINASTERIDE 5 MG PO TABS: 5.0000 mg | ORAL_TABLET | Freq: Every day | ORAL | 3 refills | Status: DC

## 2022-05-28 NOTE — Patient Instructions (Signed)

## 2022-05-28 NOTE — Progress Notes (Signed)
05/28/2022 10:08 AM   Charles Moore July 06, 1956 [id number]  Referring provider: Carlena Hurl, PA-C [address] [place],  [place] [address]  Followup BPh and prostate cancer   HPI: Charles Moore is a 66yo here for followup for prostate cancer and BPH. IPSS 9 QOL 1 on finasteride '5mg'$ . PSA decreased to 5.0 from 5.4. He has Gleason 3+3=6 prostate cancer on active surveillance.    PMH: Past Medical History:  Diagnosis Date   Allergy    seasonal with rag weed and pollen    Asthma    rare wheezing   History of kidney stones    Hypertension 10/14/2008   Nut allergy    Obesity    Wears glasses     Surgical History: Past Surgical History:  Procedure Laterality Date   CHOLECYSTECTOMY N/A 01/25/2022   Procedure: LAPAROSCOPIC CHOLECYSTECTOMY;  Surgeon: Charles Aus, DO;  Location: [facility];  Service: General;  Laterality: N/A;   LITHOTRIPSY     PROSTATE BIOPSY      Home Medications:  Allergies as of 05/28/2022       Reactions   Other Anaphylaxis   Almonds, cashews, [place] nuts   Neosporin Original [neomycin-bacitracin Zn-polymyx] Rash        Medication List        Accurate as of May 28, 2022 10:08 AM. If you have any questions, ask your nurse or doctor.          docusate sodium 100 MG capsule Commonly known as: Colace Take 1 capsule (100 mg total) by mouth 2 (two) times daily.   finasteride 5 MG tablet Commonly known as: PROSCAR Take 1 tablet (5 mg total) by mouth daily.   L-Arginine 500 MG Caps Take 500 mg by mouth daily.   lisinopril-hydrochlorothiazide 20-25 MG tablet Commonly known as: ZESTORETIC Take 1 tablet by mouth daily.   metoprolol succinate 50 MG 24 hr tablet Commonly known as: TOPROL-XL TAKE ONE TABLET BY MOUTH DAILY WITH OR IMMEDIATELY FOLLOWING A MEAL   multivitamin with minerals tablet Take 1 tablet by mouth daily.   ondansetron 8 MG disintegrating tablet Commonly known as: ZOFRAN-ODT Take 1 tablet (8 mg total)  by mouth every 8 (eight) hours as needed for nausea or vomiting.   rosuvastatin 20 MG tablet Commonly known as: CRESTOR TAKE ONE TABLET BY MOUTH DAILY        Allergies:  Allergies  Allergen Reactions   Other Anaphylaxis    Almonds, cashews, [place] nuts   Neosporin Original [Neomycin-Bacitracin Zn-Polymyx] Rash    Family History: Family History  Problem Relation Age of Onset   Other Mother        pneumonia   Cancer Father        lung   Cancer Brother        lung   Alcohol abuse Brother    Heart disease Brother 14       CABG   Hypertension Brother    Colon polyps Brother    Colon cancer Paternal Grandfather    Esophageal cancer Neg Hx    Rectal cancer Neg Hx    Stomach cancer Neg Hx     Social History:  reports that he has never smoked. He has never used smokeless tobacco. He reports current alcohol use. He reports that he does not use drugs.  ROS: All other review of systems were reviewed and are negative except what is noted above in HPI  Physical Exam: BP 119/75   Pulse 89  Constitutional:  Alert and oriented, No acute distress. HEENT: [place] AT, moist mucus membranes.  Trachea midline, no masses. Cardiovascular: No clubbing, cyanosis, or edema. Respiratory: Normal respiratory effort, no increased work of breathing. GI: Abdomen is soft, nontender, nondistended, no abdominal masses GU: No CVA tenderness.  Lymph: No cervical or inguinal lymphadenopathy. Skin: No rashes, bruises or suspicious lesions. Neurologic: Grossly intact, no focal deficits, moving all 4 extremities. Psychiatric: Normal mood and affect.  Laboratory Data: Lab Results  Component Value Date   WBC 8.7 01/05/2022   HGB 13.9 01/05/2022   HCT 42.5 01/05/2022   MCV 98.4 01/05/2022   PLT 169 01/05/2022    Lab Results  Component Value Date   CREATININE 1.19 04/25/2022    Lab Results  Component Value Date   PSA 4.66 (H) 09/18/2015   PSA 5.34 (H) 08/29/2015    No results found for:  "TESTOSTERONE"  Lab Results  Component Value Date   HGBA1C 5.0 07/04/2021    Urinalysis    Component Value Date/Time   COLORURINE YELLOW 01/05/2022 0816   APPEARANCEUR CLEAR 01/05/2022 0816   APPEARANCEUR Clear 08/15/2021 1020   LABSPEC 1.010 01/05/2022 0816   PHURINE 5.5 01/05/2022 0816   GLUCOSEU NEGATIVE 01/05/2022 0816   HGBUR TRACE (A) 01/05/2022 0816   BILIRUBINUR NEGATIVE 01/05/2022 0816   BILIRUBINUR Negative 08/15/2021 1020   KETONESUR NEGATIVE 01/05/2022 0816   PROTEINUR NEGATIVE 01/05/2022 0816   UROBILINOGEN negative 09/18/2015 1707   NITRITE NEGATIVE 01/05/2022 0816   LEUKOCYTESUR NEGATIVE 01/05/2022 0816    Lab Results  Component Value Date   LABMICR See below: 08/15/2021   WBCUA None seen 08/15/2021   LABEPIT None seen 08/15/2021   MUCUS Present 05/02/2021   BACTERIA RARE (A) 01/05/2022    Pertinent Imaging:  Results for orders placed during the hospital encounter of 01/09/09  DG Abd 1 View  Narrative Clinical Data: Pre lithotripsy, right UPJ stone.  ABDOMEN - 1 VIEW  Comparison: CT abdomen pelvis 01/02/2009  Findings: A 7 mm stone is seen in the expected location of the right ureteral pelvic junction.  No additional radiopaque calculi project over the renal outlines or expected course of the ureters bilaterally.  Phleboliths are seen in the anatomic pelvis.  IMPRESSION: Right ureteral pelvic junction stone.  Provider: Mickie Moore, Charles Moore  No results found for this or any previous visit.  No results found for this or any previous visit.  No results found for this or any previous visit.  No results found for this or any previous visit.  No results found for this or any previous visit.  No results found for this or any previous visit.  No results found for this or any previous visit.   Assessment & Plan:    1. Prostate cancer ([place]) -RTC 6 months with PSA. Patient will need a biopsy in 9 months - Urinalysis, Routine  w reflex microscopic  2. Weak urinary stream Continue finasteride '5mg'$   3. Benign prostatic hyperplasia without lower urinary tract symptoms Continue finasteride '5mg'$     No follow-ups on file.  Charles Bang, MD  [facility] [place]

## 2022-05-30 LAB — URINALYSIS, ROUTINE W REFLEX MICROSCOPIC
Bilirubin, UA: NEGATIVE
Glucose, UA: NEGATIVE
Leukocytes,UA: NEGATIVE
Nitrite, UA: NEGATIVE
RBC, UA: NEGATIVE
Specific Gravity, UA: 1.02 (ref 1.005–1.030)
Urobilinogen, Ur: 0.2 mg/dL (ref 0.2–1.0)
pH, UA: 5.5 (ref 5.0–7.5)

## 2022-05-30 LAB — MICROSCOPIC EXAMINATION
Bacteria, UA: NONE SEEN
RBC, Urine: NONE SEEN /hpf (ref 0–2)

## 2022-06-23 ENCOUNTER — Other Ambulatory Visit: Payer: Self-pay | Admitting: Medical

## 2022-07-05 ENCOUNTER — Encounter: Payer: Self-pay | Admitting: Medical

## 2022-07-05 ENCOUNTER — Ambulatory Visit (INDEPENDENT_AMBULATORY_CARE_PROVIDER_SITE_OTHER): Payer: Medicare Other | Admitting: Medical

## 2022-07-05 VITALS — BP 114/70 | HR 78 | Ht 72.5 in | Wt 268.6 lb

## 2022-07-05 DIAGNOSIS — E785 Hyperlipidemia, unspecified: Secondary | ICD-10-CM

## 2022-07-05 DIAGNOSIS — Z23 Encounter for immunization: Secondary | ICD-10-CM | POA: Diagnosis not present

## 2022-07-05 DIAGNOSIS — C61 Malignant neoplasm of prostate: Secondary | ICD-10-CM | POA: Insufficient documentation

## 2022-07-05 DIAGNOSIS — Z7185 Encounter for immunization safety counseling: Secondary | ICD-10-CM

## 2022-07-05 DIAGNOSIS — Z0000 Encounter for general adult medical examination without abnormal findings: Secondary | ICD-10-CM | POA: Diagnosis not present

## 2022-07-05 DIAGNOSIS — I10 Essential (primary) hypertension: Secondary | ICD-10-CM | POA: Diagnosis not present

## 2022-07-05 DIAGNOSIS — R809 Proteinuria, unspecified: Secondary | ICD-10-CM

## 2022-07-05 DIAGNOSIS — K635 Polyp of colon: Secondary | ICD-10-CM

## 2022-07-05 DIAGNOSIS — Z131 Encounter for screening for diabetes mellitus: Secondary | ICD-10-CM

## 2022-07-05 LAB — POCT URINALYSIS DIP (PROADVANTAGE DEVICE)
Bilirubin, UA: NEGATIVE
Blood, UA: NEGATIVE
Glucose, UA: NEGATIVE mg/dL
Ketones, POC UA: NEGATIVE mg/dL
Leukocytes, UA: NEGATIVE
Nitrite, UA: NEGATIVE
Protein Ur, POC: NEGATIVE mg/dL
Specific Gravity, Urine: 1.015
Urobilinogen, Ur: NEGATIVE
pH, UA: 6 (ref 5.0–8.0)

## 2022-07-05 NOTE — Patient Instructions (Signed)
This visit was a preventative care visit, also known as wellness visit or routine physical.   Topics typically include healthy lifestyle, diet, exercise, preventative care, vaccinations, sick and well care, proper use of emergency dept and after hours care, as well as other concerns.     Recommendations: Continue to return yearly for your annual wellness and preventative care visits.  This gives [place] a chance to discuss healthy lifestyle, exercise, vaccinations, review your chart record, and perform screenings where appropriate.  I recommend you see your eye doctor yearly for routine vision care.  I recommend you see your dentist yearly for routine dental care including hygiene visits twice yearly.   Vaccination recommendations were reviewed Immunization History  Administered Date(s) Administered   DTaP 04/07/1957, 05/26/1958   Fluad Quad(high Dose 65+) 07/05/2022   Hepatitis A 08/08/2019, 01/11/2020   Hepatitis B 08/08/2019, 01/02/2020, 05/10/2020   Influenza,inj,Quad PF,6+ Mos 07/24/2020, 07/04/2021   Influenza-Unspecified 07/03/2015   Moderna Sars-Covid-2 Vaccination 03/11/2020   Td 07/04/2021   Tdap 08/15/2011   Zoster Recombinat (Shingrix) 08/08/2019, 01/02/2020    Counseled on the influenza virus vaccine.  Vaccine information sheet given.  Influenza vaccine given after consent obtained.  Get your covid vaccine when they are available soon.  Counseled on the pneumococcal vaccine.  Vaccine information sheet given.  Pneumococcal vaccine Prevnar 20 given after consent obtained.    Screening for cancer: Colon cancer screening: Past due.  Referral back to GI  Skin cancer screening: Check your skin regularly for new changes, growing lesions, or other lesions of concern Come in for evaluation if you have skin lesions of concern.  Lung cancer screening: If you have a greater than 20 pack year history of tobacco use, then you may qualify for lung cancer screening with a chest  CT scan.   Please call your insurance company to inquire about coverage for this test.  We currently don't have screenings for other cancers besides breast, cervical, colon, and lung cancers.  If you have a strong family history of cancer or have other cancer screening concerns, please let me know.    Bone health: Get at least 150 minutes of aerobic exercise weekly Get weight bearing exercise at least once weekly Bone density test:  A bone density test is an imaging test that uses a type of X-ray to measure the amount of calcium and other minerals in your bones. The test may be used to diagnose or screen you for a condition that causes weak or thin bones (osteoporosis), predict your risk for a broken bone (fracture), or determine how well your osteoporosis treatment is working. The bone density test is recommended for females 38 and older, or females or males <40 if certain risk factors such as thyroid disease, long term use of steroids such as for asthma or rheumatological issues, vitamin D deficiency, estrogen deficiency, family history of osteoporosis, self or family history of fragility fracture in first degree relative.    Heart health: Get at least 150 minutes of aerobic exercise weekly Limit alcohol It is important to maintain a healthy blood pressure and healthy cholesterol numbers  Heart disease screening: Screening for heart disease includes screening for blood pressure, fasting lipids, glucose/diabetes screening, BMI height to weight ratio, reviewed of smoking status, physical activity, and diet.    Goals include blood pressure 120/80 or less, maintaining a healthy lipid/cholesterol profile, preventing diabetes or keeping diabetes numbers under good control, not smoking or using tobacco products, exercising most days per week or at  least 150 minutes per week of exercise, and eating healthy variety of fruits and vegetables, healthy oils, and avoiding unhealthy food choices like fried  food, fast food, high sugar and high cholesterol foods.     I reviewed recent 05/2021 echocardiogram:  IMPRESSIONS Left ventricular ejection fraction, by estimation, is 65 to 70%. The left ventricle has normal function. The left ventricle has no regional wall motion abnormalities. Left ventricular diastolic parameters are indeterminate. 1. 2. Right ventricular systolic function is normal. The right ventricular size is normal. 3. The mitral valve is normal in structure. No evidence of mitral valve regurgitation. The aortic valve is normal in structure. Aortic valve regurgitation is not visualized. No aortic stenosis is present. 4. Aortic dilatation noted. There is mild dilatation of the ascending aorta, measuring 40 mm.   Medical care options: I recommend you continue to seek care here first for routine care.  We try really hard to have available appointments Monday through Friday daytime hours for sick visits, acute visits, and physicals.  Urgent care should be used for after hours and weekends for significant issues that cannot wait till the next day.  The emergency department should be used for significant potentially life-threatening emergencies.  The emergency department is expensive, can often have long wait times for less significant concerns, so try to utilize primary care, urgent care, or telemedicine when possible to avoid unnecessary trips to the emergency department.  Virtual visits and telemedicine have been introduced since the pandemic started in 2020, and can be convenient ways to receive medical care.  We offer virtual appointments as well to assist you in a variety of options to seek medical care.    Separate significant issues discussed: Prostate cancer diagnosed 2022.  Reviewed 05/2022 notes from urology, low risk prostate cancer, they are continuing surveillance q41moand annual MRI alternated with biopsy.     ED - on sildenafil per urology, works well for  him  Hypertension-continue current medication, advised on monitoring, discussed goal blood pressures  Hyperlipidemia-continue current medication, routine labs today  Microalbuminuria likely due to abnormal blood pressures in the past.  Maintain good blood pressure control  Obesity-discussed need to lose weight through healthy diet and exercise  Colon polyp-follow-up with gastroenterology for updated screening

## 2022-07-05 NOTE — Addendum Note (Signed)
Addended by: Minette Headland A on: 07/05/2022 09:22 AM   Modules accepted: Orders

## 2022-07-05 NOTE — Addendum Note (Signed)
Addended by: Minette Headland A on: 07/05/2022 09:24 AM   Modules accepted: Orders

## 2022-07-05 NOTE — Progress Notes (Signed)
Complete physical exam   Patient: Charles Moore   DOB: [birth year]   66 y.o. Male  MRN: [id number] Visit Date: 07/05/2022  Chief Complaint  Patient presents with   fasting cpe     Fasting cpe, flu shot, no concerns     Care Team Dr. Joni Moore, ortho Dr. Forsyth Moore, GI Dr. Nicolette Moore, urology Dr. Tyler Moore, radiation oncology Dr. Eleonore Moore, cardiology Not currently seeing dentist or eye doctor Charles Moore, Charles Eng, PA-C here for primary care   Concerns: He is compliant with his medication. No other c/o  He plans to see eye doctor and dentist this year.  Last year he was really dealing with the prostate cancer diagnosis and follow-up .    Past Medical History:  Diagnosis Date   Allergy    seasonal with rag weed and pollen    Asthma    rare wheezing   History of kidney stones    Hyperlipidemia    Hypertension 10/14/2008   Nut allergy    Obesity    Wears glasses    Past Surgical History:  Procedure Laterality Date   CHOLECYSTECTOMY N/A 01/25/2022   Procedure: LAPAROSCOPIC CHOLECYSTECTOMY;  Surgeon: Charles Aus, DO;  Location: [facility];  Service: General;  Laterality: N/A;   COLONOSCOPY  11/2015   polyps, Dr.  Cellar   LITHOTRIPSY     PROSTATE BIOPSY      Family Status  Relation Name Status   Mother  Deceased   Father  Deceased   Brother percy Deceased   Brother Charles Moore Alive   Brother Charles Moore Alive   Sister Charles Moore Alive   Brother Charles Moore Alive   Brother Charles Moore Alive   PGF  (Not Specified)   Neg Hx  (Not Specified)   Family History  Problem Relation Age of Onset   Other Mother        pneumonia   Cancer Father        lung   Cancer Brother        lung   Alcohol abuse Brother    Heart disease Brother 56       CABG   Hypertension Brother    Colon polyps Brother    Colon cancer Paternal Grandfather    Esophageal cancer Neg Hx    Rectal cancer Neg Hx    Stomach cancer Neg Hx    Allergies  Allergen  Reactions   Other Anaphylaxis    Almonds, cashews, [place] nuts   Neosporin Original [Neomycin-Bacitracin Zn-Polymyx] Rash    Patient Care Team: Charles Moore, Charles Moore as PCP - General (Family Medicine)   Medications: Outpatient Medications Prior to Visit  Medication Sig   docusate sodium (COLACE) 100 MG capsule Take 1 capsule (100 mg total) by mouth 2 (two) times daily.   finasteride (PROSCAR) 5 MG tablet Take 1 tablet (5 mg total) by mouth daily.   L-Arginine 500 MG CAPS Take 500 mg by mouth daily.   lisinopril-hydrochlorothiazide (ZESTORETIC) 20-25 MG tablet Take 1 tablet by mouth daily.   metoprolol succinate (TOPROL-XL) 50 MG 24 hr tablet TAKE ONE TABLET BY MOUTH DAILY WITH OR IMMEDIATELY FOLLOWING A MEAL   Multiple Vitamins-Minerals (MULTIVITAMIN WITH MINERALS) tablet Take 1 tablet by mouth daily.   rosuvastatin (CRESTOR) 20 MG tablet TAKE 1 TABLET BY MOUTH DAILY   [DISCONTINUED] ondansetron (ZOFRAN-ODT) 8 MG disintegrating tablet Take 1 tablet (8 mg total) by mouth every 8 (eight) hours as needed for nausea or vomiting.  No facility-administered medications prior to visit.    Review of Systems Review of Systems Constitutional: -fever, -chills, -sweats, -unexpected weight change, -anorexia, -fatigue Allergy: -sneezing, -itching, -congestion Dermatology: denies changing moles, rash, lumps, new worrisome lesions ENT: -runny nose, -ear pain, -sore throat, -hoarseness, -sinus pain, -teeth pain, -tinnitus, -hearing loss, -epistaxis Cardiology:  -chest pain, -palpitations, -edema, -orthopnea, -paroxysmal nocturnal dyspnea Respiratory: -cough, -shortness of breath, -dyspnea on exertion, -wheezing, -hemoptysis Gastroenterology: -abdominal pain, -nausea, -vomiting, -diarrhea, -constipation, -blood in stool, -changes in bowel movement, -dysphagia Hematology: -bleeding or bruising problems Musculoskeletal: -arthralgias, -myalgias, -joint swelling, -back pain, -neck pain, -cramping, -gait  changes Ophthalmology: -vision changes, -eye redness, -itching, -discharge Urology: -dysuria, -difficulty urinating, -hematuria, -urinary frequency, -urgency, incontinence Neurology: -headache, -weakness, -tingling, -numbness, -speech abnormality, -memory loss, -falls, -dizziness Psychology:  -depressed mood, -agitation, -sleep problems     Objective    BP 114/70   Pulse 78   Ht 6' 0.5" (1.842 m)   Wt 268 lb 9.6 oz (121.8 kg)   BMI 35.93 kg/m   BP Readings from Last 3 Encounters:  07/05/22 114/70  05/28/22 119/75  04/25/22 (!) 140/100   Wt Readings from Last 3 Encounters:  07/05/22 268 lb 9.6 oz (121.8 kg)  04/25/22 267 lb 6.4 oz (121.3 kg)  02/12/22 261 lb (118.4 kg)    General appearence: alert, no distress, WD/WN, African American male HEENT: normocephalic, sclerae anicteric, PERRLA, EOMi Neck: supple, no lymphadenopathy, no thyromegaly, no masses, no bruits Heart: RRR, normal S1, S2, no murmurs Lungs: CTA bilaterally, no wheezes, rhonchi, or rales Abdomen: +bs, soft, non tender, non distended, no masses, no hepatomegaly, no splenomegaly Back: non tender Musculoskeletal: nontender, no swelling, no obvious deformity Extremities: no edema, no cyanosis, no clubbing Pulses: 2+ symmetric, upper and lower extremities, normal cap refill Neurological: alert, oriented x 3, CN2-12 intact, strength normal upper extremities and lower extremities, sensation normal throughout, DTRs 2+ throughout, no cerebellar signs, gait normal Psychiatric: normal affect, behavior normal, pleasant  GU/rectal - deferred to urology    Last depression screening scores    07/05/2022    8:47 AM 07/04/2021   10:20 AM 03/06/2021    9:36 AM  PHQ 2/9 Scores  PHQ - 2 Score 0 0 0   Last fall risk screening    07/05/2022    8:46 AM  Fall Risk   Falls in the past year? 0  Number falls in past yr: 0  Injury with Fall? 0  Risk for fall due to : No Fall Risks  Follow up Falls evaluation completed       Assessment & Plan   Encounter Diagnoses  Name Primary?   Routine general medical examination at a health care facility Yes   Essential hypertension    Needs flu shot    Prostate cancer [facility])    Vaccine counseling    Screening for diabetes mellitus    Microalbuminuria    Dyslipidemia    Polyp of colon, unspecified part of colon, unspecified type    Need for pneumococcal 20-valent conjugate vaccination     This visit was a preventative care visit, also known as wellness visit or routine physical.   Topics typically include healthy lifestyle, diet, exercise, preventative care, vaccinations, sick and well care, proper use of emergency dept and after hours care, as well as other concerns.     Recommendations: Continue to return yearly for your annual wellness and preventative care visits.  This gives [place] a chance to discuss healthy lifestyle, exercise, vaccinations, review  your chart record, and perform screenings where appropriate.  I recommend you see your eye doctor yearly for routine vision care.  I recommend you see your dentist yearly for routine dental care including hygiene visits twice yearly.   Vaccination recommendations were reviewed Immunization History  Administered Date(s) Administered   DTaP 04/07/1958, 05/26/1958   Fluad Quad(high Dose 65+) 07/05/2022   Hepatitis A 08/08/2019, 01/11/2020   Hepatitis B 08/08/2019, 01/02/2020, 05/10/2020   Influenza,inj,Quad PF,6+ Mos 07/24/2020, 07/04/2021   Influenza-Unspecified 07/03/2015   Moderna Sars-Covid-2 Vaccination 03/11/2020   Td 07/04/2021   Tdap 08/15/2011   Zoster Recombinat (Shingrix) 08/08/2019, 01/02/2020    Counseled on the influenza virus vaccine.  Vaccine information sheet given.  Influenza vaccine given after consent obtained.  Get your covid vaccine when they are available soon.  Counseled on the pneumococcal vaccine.  Vaccine information sheet given.  Pneumococcal vaccine Prevnar 20 given after  consent obtained.    Screening for cancer: Colon cancer screening: Past due.  Referral back to GI  Skin cancer screening: Check your skin regularly for new changes, growing lesions, or other lesions of concern Come in for evaluation if you have skin lesions of concern.  Lung cancer screening: If you have a greater than 20 pack year history of tobacco use, then you may qualify for lung cancer screening with a chest CT scan.   Please call your insurance company to inquire about coverage for this test.  We currently don't have screenings for other cancers besides breast, cervical, colon, and lung cancers.  If you have a strong family history of cancer or have other cancer screening concerns, please let me know.    Bone health: Get at least 150 minutes of aerobic exercise weekly Get weight bearing exercise at least once weekly Bone density test:  A bone density test is an imaging test that uses a type of X-ray to measure the amount of calcium and other minerals in your bones. The test may be used to diagnose or screen you for a condition that causes weak or thin bones (osteoporosis), predict your risk for a broken bone (fracture), or determine how well your osteoporosis treatment is working. The bone density test is recommended for females 60 and older, or females or males <97 if certain risk factors such as thyroid disease, long term use of steroids such as for asthma or rheumatological issues, vitamin D deficiency, estrogen deficiency, family history of osteoporosis, self or family history of fragility fracture in first degree relative.    Heart health: Get at least 150 minutes of aerobic exercise weekly Limit alcohol It is important to maintain a healthy blood pressure and healthy cholesterol numbers  Heart disease screening: Screening for heart disease includes screening for blood pressure, fasting lipids, glucose/diabetes screening, BMI height to weight ratio, reviewed of smoking  status, physical activity, and diet.    Goals include blood pressure 120/80 or less, maintaining a healthy lipid/cholesterol profile, preventing diabetes or keeping diabetes numbers under good control, not smoking or using tobacco products, exercising most days per week or at least 150 minutes per week of exercise, and eating healthy variety of fruits and vegetables, healthy oils, and avoiding unhealthy food choices like fried food, fast food, high sugar and high cholesterol foods.     I reviewed recent 05/2021 echocardiogram:  IMPRESSIONS Left ventricular ejection fraction, by estimation, is 65 to 70%. The left ventricle has normal function. The left ventricle has no regional wall motion abnormalities. Left ventricular diastolic parameters are  indeterminate. 1. 2. Right ventricular systolic function is normal. The right ventricular size is normal. 3. The mitral valve is normal in structure. No evidence of mitral valve regurgitation. The aortic valve is normal in structure. Aortic valve regurgitation is not visualized. No aortic stenosis is present. 4. Aortic dilatation noted. There is mild dilatation of the ascending aorta, measuring 40 mm.   Medical care options: I recommend you continue to seek care here first for routine care.  We try really hard to have available appointments Monday through Friday daytime hours for sick visits, acute visits, and physicals.  Urgent care should be used for after hours and weekends for significant issues that cannot wait till the next day.  The emergency department should be used for significant potentially life-threatening emergencies.  The emergency department is expensive, can often have long wait times for less significant concerns, so try to utilize primary care, urgent care, or telemedicine when possible to avoid unnecessary trips to the emergency department.  Virtual visits and telemedicine have been introduced since the pandemic started in 2020, and  can be convenient ways to receive medical care.  We offer virtual appointments as well to assist you in a variety of options to seek medical care.    Separate significant issues discussed: Prostate cancer diagnosed 2022.  Reviewed 05/2022 notes from urology, low risk prostate cancer, they are continuing surveillance [id number] annual MRI alternated with biopsy.     ED - on sildenafil per urology, works well for him  Hypertension-continue current medication, advised on monitoring, discussed goal blood pressures  Hyperlipidemia-continue current medication, routine labs today  Microalbuminuria likely due to abnormal blood pressures in the past.  Maintain good blood pressure control  Obesity-discussed need to lose weight through healthy diet and exercise  Colon polyp-follow-up with gastroenterology for updated screening   ABrylanwas seen today for fasting cpe .  Diagnoses and all orders for this visit:  Routine general medical examination at a health care facility -     Comprehensive metabolic panel -     CBC with Differential/Platelet -     Lipid panel -     Hemoglobin A1c -     HIV Antibody (routine testing w rflx) -     Hepatitis C antibody  Essential hypertension  Needs flu shot -     Flu Vaccine QUAD High Dose(Fluad)  Prostate cancer (HCC)  Vaccine counseling  Screening for diabetes mellitus -     Hemoglobin A1c  Microalbuminuria  Dyslipidemia -     Lipid panel  Polyp of colon, unspecified part of colon, unspecified type -     Ambulatory referral to Gastroenterology  Need for pneumococcal 20-valent conjugate vaccination    Follow-up pending labs, yearly for physical

## 2022-07-06 ENCOUNTER — Other Ambulatory Visit: Payer: Self-pay | Admitting: Medical

## 2022-07-06 LAB — CBC WITH DIFFERENTIAL/PLATELET
Basophils Absolute: 0.1 10*3/uL (ref 0.0–0.2)
Basos: 1 %
EOS (ABSOLUTE): 0.3 10*3/uL (ref 0.0–0.4)
Eos: 3 %
Hematocrit: 40.7 % (ref 37.5–51.0)
Hemoglobin: 13.6 g/dL (ref 13.0–17.7)
Immature Grans (Abs): 0 10*3/uL (ref 0.0–0.1)
Immature Granulocytes: 0 %
Lymphocytes Absolute: 2.7 10*3/uL (ref 0.7–3.1)
Lymphs: 32 %
MCH: 31.7 pg (ref 26.6–33.0)
MCHC: 33.4 g/dL (ref 31.5–35.7)
MCV: 95 fL (ref 79–97)
Monocytes Absolute: 0.7 10*3/uL (ref 0.1–0.9)
Monocytes: 8 %
Neutrophils Absolute: 4.7 10*3/uL (ref 1.4–7.0)
Neutrophils: 56 %
Platelets: 201 10*3/uL (ref 150–450)
RBC: 4.29 x10E6/uL (ref 4.14–5.80)
RDW: 11.4 % — ABNORMAL LOW (ref 11.6–15.4)
WBC: 8.5 10*3/uL (ref 3.4–10.8)

## 2022-07-06 LAB — HEMOGLOBIN A1C
Est. average glucose Bld gHb Est-mCnc: 100 mg/dL
Hgb A1c MFr Bld: 5.1 % (ref 4.8–5.6)

## 2022-07-06 LAB — COMPREHENSIVE METABOLIC PANEL
ALT: 19 IU/L (ref 0–44)
AST: 26 IU/L (ref 0–40)
Albumin/Globulin Ratio: 1.6 (ref 1.2–2.2)
Albumin: 4.5 g/dL (ref 3.9–4.9)
Alkaline Phosphatase: 62 IU/L (ref 44–121)
BUN/Creatinine Ratio: 13 (ref 10–24)
BUN: 14 mg/dL (ref 8–27)
Bilirubin Total: 0.8 mg/dL (ref 0.0–1.2)
CO2: 24 mmol/L (ref 20–29)
Calcium: 9.8 mg/dL (ref 8.6–10.2)
Chloride: 100 mmol/L (ref 96–106)
Creatinine, Ser: 1.1 mg/dL (ref 0.76–1.27)
Globulin, Total: 2.9 g/dL (ref 1.5–4.5)
Glucose: 98 mg/dL (ref 70–99)
Potassium: 3.6 mmol/L (ref 3.5–5.2)
Sodium: 140 mmol/L (ref 134–144)
Total Protein: 7.4 g/dL (ref 6.0–8.5)
eGFR: 74 mL/min/{1.73_m2} (ref >59)

## 2022-07-06 LAB — LIPID PANEL
Chol/HDL Ratio: 2.5 ratio (ref 0.0–5.0)
Cholesterol, Total: 108 mg/dL (ref 100–199)
HDL: 44 mg/dL (ref >39)
LDL Chol Calc (NIH): 47 mg/dL (ref 0–99)
Triglycerides: 84 mg/dL (ref 0–149)
VLDL Cholesterol Cal: 17 mg/dL (ref 5–40)

## 2022-07-06 LAB — HIV ANTIBODY (ROUTINE TESTING W REFLEX): HIV Screen 4th Generation wRfx: NONREACTIVE

## 2022-07-06 LAB — HEPATITIS C ANTIBODY: Hep C Virus Ab: NONREACTIVE

## 2022-07-06 MED ORDER — ROSUVASTATIN CALCIUM 20 MG PO TABS: 20.0000 mg | ORAL_TABLET | Freq: Every day | ORAL | 3 refills | Status: DC

## 2022-07-06 MED ORDER — METOPROLOL SUCCINATE ER 50 MG PO TB24: ORAL_TABLET | ORAL | 3 refills | Status: DC

## 2022-07-06 MED ORDER — LISINOPRIL-HYDROCHLOROTHIAZIDE 20-25 MG PO TABS: 1.0000 | ORAL_TABLET | Freq: Every day | ORAL | 3 refills | Status: DC

## 2022-07-23 ENCOUNTER — Encounter: Payer: Self-pay | Admitting: Internal Medicine

## 2022-08-14 ENCOUNTER — Other Ambulatory Visit: Payer: Self-pay | Admitting: Urology

## 2022-09-10 ENCOUNTER — Other Ambulatory Visit: Payer: Self-pay | Admitting: Medical

## 2022-11-01 ENCOUNTER — Encounter: Payer: Self-pay | Admitting: Gastroenterology

## 2022-11-18 ENCOUNTER — Ambulatory Visit (AMBULATORY_SURGERY_CENTER): Payer: Medicare Other | Admitting: *Deleted

## 2022-11-18 VITALS — Ht 72.5 in | Wt 270.0 lb

## 2022-11-18 DIAGNOSIS — Z86010 Personal history of colonic polyps: Secondary | ICD-10-CM

## 2022-11-18 MED ORDER — NA SULFATE-K SULFATE-MG SULF 17.5-3.13-1.6 GM/177ML PO SOLN: 1.0000 | Freq: Once | ORAL | 0 refills | Status: AC

## 2022-11-18 NOTE — Progress Notes (Signed)
No egg or soy allergy known to patient  No issues known to pt with past sedation with any surgeries or procedures Patient denies ever being told they had issues or difficulty with intubation  No FH of Malignant Hyperthermia Pt is not on diet pills Pt is not on  home 02  Pt is not on blood thinners  Pt denies issues with constipation  Pt is not on dialysis Pt denies any upcoming cardiac testing Pt encouraged to use to use Singlecare or Goodrx to reduce cost  Patient's chart reviewed by John Nulty CNRA prior to previsit and patient appropriate for the [facility].  Previsit completed and red dot placed by patient's name on their procedure day (on provider's schedule).  . Visit by phone Instructions reviewed with pt and pt states understanding. Instructed to review again prior to procedure. Pt states they will.  Instructions sent by mail and my chart 

## 2022-11-22 ENCOUNTER — Ambulatory Visit: Payer: Medicare Other | Admitting: Urology

## 2022-11-26 ENCOUNTER — Ambulatory Visit (INDEPENDENT_AMBULATORY_CARE_PROVIDER_SITE_OTHER): Payer: Medicare Other

## 2022-11-26 VITALS — Ht 74.0 in | Wt 270.0 lb

## 2022-11-26 DIAGNOSIS — Z0000 Encounter for general adult medical examination without abnormal findings: Secondary | ICD-10-CM | POA: Diagnosis not present

## 2022-11-26 NOTE — Patient Instructions (Signed)
Charles Moore , Thank you for taking time to come for your Medicare Wellness Visit. I appreciate your ongoing commitment to your health goals. Please review the following plan we discussed and let me know if I can assist you in the future.   These are the goals we discussed:  Goals      Patient Stated     11/26/2022, no goals        This is a list of the screening recommended for you and due dates:  Health Maintenance  Topic Date Due   Colon Cancer Screening  11/30/2018   COVID-19 Vaccine (2 - Moderna risk series) 04/08/2020   Medicare Annual Wellness Visit  11/27/2023   DTaP/Tdap/Td vaccine (5 - Td or Tdap) 07/05/2031   Pneumonia Vaccine  Completed   Flu Shot  Completed   Hepatitis C Screening: USPSTF Recommendation to screen - Ages 18-79 yo.  Completed   HIV Screening  Completed   Zoster (Shingles) Vaccine  Completed   HPV Vaccine  Aged Out    Advanced directives: Advance directive discussed with you today.   Conditions/risks identified: none  Next appointment: Follow up in one year for your annual wellness visit.   Preventive Care 66 Years and Older, Male  Preventive care refers to lifestyle choices and visits with your health care provider that can promote health and wellness. What does preventive care include? A yearly physical exam. This is also called an annual well check. Dental exams once or twice a year. Routine eye exams. Ask your health care provider how often you should have your eyes checked. Personal lifestyle choices, including: Daily care of your teeth and gums. Regular physical activity. Eating a healthy diet. Avoiding tobacco and drug use. Limiting alcohol use. Practicing safe sex. Taking low doses of aspirin every day. Taking vitamin and mineral supplements as recommended by your health care provider. What happens during an annual well check? The services and screenings done by your health care provider during your annual well check will depend on  your age, overall health, lifestyle risk factors, and family history of disease. Counseling  Your health care provider may ask you questions about your: Alcohol use. Tobacco use. Drug use. Emotional well-being. Home and relationship well-being. Sexual activity. Eating habits. History of falls. Memory and ability to understand (cognition). Work and work Statistician. Screening  You may have the following tests or measurements: Height, weight, and BMI. Blood pressure. Lipid and cholesterol levels. These may be checked every 5 years, or more frequently if you are over 55 years old. Skin check. Lung cancer screening. You may have this screening every year starting at age 66 if you have a 30-pack-year history of smoking and currently smoke or have quit within the past 15 years. Fecal occult blood test (FOBT) of the stool. You may have this test every year starting at age 66. Flexible sigmoidoscopy or colonoscopy. You may have a sigmoidoscopy every 5 years or a colonoscopy every 10 years starting at age 66. Prostate cancer screening. Recommendations will vary depending on your family history and other risks. Hepatitis C blood test. Hepatitis B blood test. Sexually transmitted disease (STD) testing. Diabetes screening. This is done by checking your blood sugar (glucose) after you have not eaten for a while (fasting). You may have this done every 1-3 years. Abdominal aortic aneurysm (AAA) screening. You may need this if you are a current or former smoker. Osteoporosis. You may be screened starting at age 72 if you are at high  risk. Talk with your health care provider about your test results, treatment options, and if necessary, the need for more tests. Vaccines  Your health care provider may recommend certain vaccines, such as: Influenza vaccine. This is recommended every year. Tetanus, diphtheria, and acellular pertussis (Tdap, Td) vaccine. You may need a Td booster every 10 years. Zoster  vaccine. You may need this after age 66. Pneumococcal 13-valent conjugate (PCV13) vaccine. One dose is recommended after age 66. Pneumococcal polysaccharide (PPSV23) vaccine. One dose is recommended after age 25. Talk to your health care provider about which screenings and vaccines you need and how often you need them. This information is not intended to replace advice given to you by your health care provider. Make sure you discuss any questions you have with your health care provider. Document Released: 10/27/2015 Document Revised: 06/19/2016 Document Reviewed: 08/01/2015 Elsevier Interactive Patient Education  2017 [place] Prevention in the Home Falls can cause injuries. They can happen to people of all ages. There are many things you can do to make your home safe and to help prevent falls. What can I do on the outside of my home? Regularly fix the edges of walkways and driveways and fix any cracks. Remove anything that might make you trip as you walk through a door, such as a raised step or threshold. Trim any bushes or trees on the path to your home. Use bright outdoor lighting. Clear any walking paths of anything that might make someone trip, such as rocks or tools. Regularly check to see if handrails are loose or broken. Make sure that both sides of any steps have handrails. Any raised decks and porches should have guardrails on the edges. Have any leaves, snow, or ice cleared regularly. Use sand or salt on walking paths during winter. Clean up any spills in your garage right away. This includes oil or grease spills. What can I do in the bathroom? Use night lights. Install grab bars by the toilet and in the tub and shower. Do not use towel bars as grab bars. Use non-skid mats or decals in the tub or shower. If you need to sit down in the shower, use a plastic, non-slip stool. Keep the floor dry. Clean up any water that spills on the floor as soon as it happens. Remove  soap buildup in the tub or shower regularly. Attach bath mats securely with double-sided non-slip rug tape. Do not have throw rugs and other things on the floor that can make you trip. What can I do in the bedroom? Use night lights. Make sure that you have a light by your bed that is easy to reach. Do not use any sheets or blankets that are too big for your bed. They should not hang down onto the floor. Have a firm chair that has side arms. You can use this for support while you get dressed. Do not have throw rugs and other things on the floor that can make you trip. What can I do in the kitchen? Clean up any spills right away. Avoid walking on wet floors. Keep items that you use a lot in easy-to-reach places. If you need to reach something above you, use a strong step stool that has a grab bar. Keep electrical cords out of the way. Do not use floor polish or wax that makes floors slippery. If you must use wax, use non-skid floor wax. Do not have throw rugs and other things on the floor that can  make you trip. What can I do with my stairs? Do not leave any items on the stairs. Make sure that there are handrails on both sides of the stairs and use them. Fix handrails that are broken or loose. Make sure that handrails are as long as the stairways. Check any carpeting to make sure that it is firmly attached to the stairs. Fix any carpet that is loose or worn. Avoid having throw rugs at the top or bottom of the stairs. If you do have throw rugs, attach them to the floor with carpet tape. Make sure that you have a light switch at the top of the stairs and the bottom of the stairs. If you do not have them, ask someone to add them for you. What else can I do to help prevent falls? Wear shoes that: Do not have high heels. Have rubber bottoms. Are comfortable and fit you well. Are closed at the toe. Do not wear sandals. If you use a stepladder: Make sure that it is fully opened. Do not climb a  closed stepladder. Make sure that both sides of the stepladder are locked into place. Ask someone to hold it for you, if possible. Clearly mark and make sure that you can see: Any grab bars or handrails. First and last steps. Where the edge of each step is. Use tools that help you move around (mobility aids) if they are needed. These include: Canes. Walkers. Scooters. Crutches. Turn on the lights when you go into a dark area. Replace any light bulbs as soon as they burn out. Set up your furniture so you have a clear path. Avoid moving your furniture around. If any of your floors are uneven, fix them. If there are any pets around you, be aware of where they are. Review your medicines with your doctor. Some medicines can make you feel dizzy. This can increase your chance of falling. Ask your doctor what other things that you can do to help prevent falls. This information is not intended to replace advice given to you by your health care provider. Make sure you discuss any questions you have with your health care provider. Document Released: 07/27/2009 Document Revised: 03/07/2016 Document Reviewed: 11/04/2014 Elsevier Interactive Patient Education  2017 [employer].

## 2022-11-26 NOTE — Progress Notes (Signed)
I connected with  Charles Moore on 11/26/22 by a audio enabled telemedicine application and verified that I am speaking with the correct person using two identifiers.  Patient Location: Home  Provider Location: Office/Clinic  I discussed the limitations of evaluation and management by telemedicine. The patient expressed understanding and agreed to proceed.  Subjective:   Charles Moore is a 67 y.o. male who presents for an Initial Medicare Annual Wellness Visit.  Review of Systems     Cardiac Risk Factors include: advanced age (>57mn, >>52women);dyslipidemia;hypertension;male gender;obesity (BMI >30kg/m2)     Objective:    Today's Vitals   11/26/22 1526  Weight: 270 lb (122.5 kg)  Height: 6' 2"$  (1.88 m)   Body mass index is 34.67 kg/m.     11/26/2022    3:30 PM 01/25/2022    7:55 AM 01/23/2022    8:52 AM 01/05/2022    5:23 AM 06/26/2021    8:48 AM 10/26/2015    4:18 PM 10/20/2015    7:50 AM  Advanced Directives  Does Patient Have a Medical Advance Directive? No No No No No No No  Would patient like information on creating a medical advance directive?  No - Patient declined No - Patient declined No - Patient declined No - Patient declined      Current Medications (verified) Outpatient Encounter Medications as of 11/26/2022  Medication Sig   Ascorbic Acid (VITAMIN C) 1000 MG tablet Take 1,000 mg by mouth daily.   finasteride (PROSCAR) 5 MG tablet TAKE ONE TABLET BY MOUTH DAILY   L-Arginine 500 MG CAPS Take 500 mg by mouth daily.   lisinopril-hydrochlorothiazide (ZESTORETIC) 20-25 MG tablet Take 1 tablet by mouth daily.   metoprolol succinate (TOPROL-XL) 50 MG 24 hr tablet TAKE 1 TABLET BY MOUTH DAILY WITH OR IMMEDIATELY FOLLOWING A MEAL   Multiple Vitamins-Minerals (MULTIVITAMIN WITH MINERALS) tablet Take 1 tablet by mouth daily.   rosuvastatin (CRESTOR) 20 MG tablet Take 1 tablet (20 mg total) by mouth daily.   No facility-administered encounter medications on file as  of 11/26/2022.    Allergies (verified) Other and Neosporin original [neomycin-bacitracin zn-polymyx]   History: Past Medical History:  Diagnosis Date   Allergy    seasonal with rag weed and pollen    Asthma    rare wheezing   [place]    History of kidney stones    Hyperlipidemia    Hypertension 10/14/2008   Nut allergy    Obesity    Wears glasses    Past Surgical History:  Procedure Laterality Date   CHOLECYSTECTOMY N/A 01/25/2022   Procedure: LAPAROSCOPIC CHOLECYSTECTOMY;  Surgeon: PRusty Aus DO;  Location: [facility];  Service: General;  Laterality: N/A;   COLONOSCOPY  11/2015   polyps, Dr. SCarolina Cellar  LITHOTRIPSY     PROSTATE BIOPSY     Family History  Problem Relation Age of Onset   Other Mother        pneumonia   Cancer Father        lung   Cancer Brother        lung   Alcohol abuse Brother    Heart disease Brother 633      CABG   Hypertension Brother    Colon polyps Brother    Colon cancer Paternal Grandfather    Esophageal cancer Neg Hx    Rectal cancer Neg Hx    Stomach cancer Neg Hx    Social History   Socioeconomic History  Marital status: Married    Spouse name: Not on file   Number of children: 1   Years of education: Not on file   Highest education level: Not on file  Occupational History   Occupation: Retired Librarian, academic [employer]  Tobacco Use   Smoking status: Never   Smokeless tobacco: Never  Vaping Use   Vaping Use: Never used  Substance and Sexual Activity   Alcohol use: Not Currently    Comment: 1 beer a month   Drug use: No   Sexual activity: Yes  Other Topics Concern   Not on file  Social History Narrative   Married, 1 child, 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  Exercise - with walking.   06/2022   Social Determinants of Health   Financial Resource Strain: Low Risk  (11/26/2022)   Overall Financial Resource Strain (CARDIA)    Difficulty of Paying Living Expenses: Not hard at all   Food Insecurity: No Food Insecurity (11/26/2022)   Hunger Vital Sign    Worried About Running Out of Food in the Last Year: Never true    Ran Out of Food in the Last Year: Never true  Transportation Needs: No Transportation Needs (11/26/2022)   PRAPARE - Hydrologist (Medical): No    Lack of Transportation (Non-Medical): No  Physical Activity: Inactive (11/26/2022)   Exercise Vital Sign    Days of Exercise per Week: 0 days    Minutes of Exercise per Session: 0 min  Stress: No Stress Concern Present (11/26/2022)   [place]    Feeling of Stress : Not at all  Social Connections: Not on file    Tobacco Counseling Counseling given: Not Answered   Clinical Intake:  Pre-visit preparation completed: Yes  Pain : No/denies pain     Nutritional Status: BMI > 30  Obese Nutritional Risks: None Diabetes: No  How often do you need to have someone help you when you read instructions, pamphlets, or other written materials from your doctor or pharmacy?: 1 - Never  Diabetic? no  Interpreter Needed?: No  Information entered by :: NAllen LPN   Activities of Daily Living    11/26/2022    3:33 PM 01/23/2022    8:55 AM  In your present state of health, do you have any difficulty performing the following activities:  Hearing? 0   Vision? 0   Difficulty concentrating or making decisions? 0   Walking or climbing stairs? 0   Dressing or bathing? 0   Doing errands, shopping? 0 0  Preparing Food and eating ? N   Using the Toilet? N   In the past six months, have you accidently leaked urine? N   Do you have problems with loss of bowel control? N   Managing your Medications? N   Managing your Finances? N   Housekeeping or managing your Housekeeping? N     Patient Care Team: Tysinger, Camelia Eng, PA-C as PCP - General (Family Medicine)  Indicate any recent Medical Services you may have received  from other than [facility] providers in the past year (date may be approximate).     Assessment:   This is a routine wellness examination for [place].  Hearing/Vision screen Vision Screening - Comments:: No regular eye exams,  Dietary issues and exercise activities discussed: Current Exercise Habits: The patient does not participate in regular exercise at present   Goals Addressed  This Visit's Progress    Patient Stated       11/26/2022, no goals       Depression Screen    11/26/2022    3:32 PM 07/05/2022    8:47 AM 07/04/2021   10:20 AM 03/06/2021    9:36 AM 07/24/2020   12:21 PM 05/03/2019    1:35 PM 08/29/2015   10:22 AM  PHQ 2/9 Scores  PHQ - 2 Score 0 0 0 0 0 0 0  PHQ- 9 Score 0          Fall Risk    11/26/2022    3:31 PM 07/05/2022    8:46 AM 01/10/2022   10:30 AM 07/04/2021   10:21 AM 03/06/2021    9:36 AM  Fall Risk   Falls in the past year? 1 0 0 1 0  Comment fell down the steps      Number falls in past yr: 0 0  1 0  Comment    fell in wet grass and installing lightbulb   Injury with Fall? 0 0  0 0  Risk for fall due to : Medication side effect No Fall Risks  History of fall(s) No Fall Risks  Follow up Falls prevention discussed;Education provided;Falls evaluation completed Falls evaluation completed Falls evaluation completed Falls evaluation completed Falls evaluation completed    FALL RISK PREVENTION PERTAINING TO THE HOME:  Any stairs in or around the home? Yes  If so, are there any without handrails? No  Home free of loose throw rugs in walkways, pet beds, electrical cords, etc? Yes  Adequate lighting in your home to reduce risk of falls? Yes   ASSISTIVE DEVICES UTILIZED TO PREVENT FALLS:  Life alert? No  Use of a cane, walker or w/c? No  Grab bars in the bathroom? No  Shower chair or bench in shower? Yes  Elevated toilet seat or a handicapped toilet? Yes   TIMED UP AND GO:  Was the test performed? No .      Cognitive Function:         11/26/2022    3:35 PM  6CIT Screen  What Year? 0 points  What month? 0 points  What time? 0 points  Count back from 20 0 points  Months in reverse 4 points  Repeat phrase 4 points  Total Score 8 points    Immunizations Immunization History  Administered Date(s) Administered   DTaP 04/07/1957, 05/26/1957   Fluad Quad(high Dose 65+) 07/05/2022   Hepatitis A 08/08/2019, 01/11/2020   Hepatitis B 08/08/2019, 01/02/2020, 05/10/2020   Influenza,inj,Quad PF,6+ Mos 07/24/2020, 07/04/2021   Influenza-Unspecified 07/03/2015   Moderna Sars-Covid-2 Vaccination 03/11/2020   PNEUMOCOCCAL CONJUGATE-20 07/05/2022   Td 07/04/2021   Tdap 08/15/2011   Zoster Recombinat (Shingrix) 08/08/2019, 01/02/2020    TDAP status: Up to date  Flu Vaccine status: Up to date  Pneumococcal vaccine status: Up to date  Covid-19 vaccine status: Completed vaccines  Qualifies for Shingles Vaccine? Yes   Zostavax completed Yes   Shingrix Completed?: Yes  Screening Tests Health Maintenance  Topic Date Due   Medicare Annual Wellness (AWV)  Never done   COLONOSCOPY (Pts 45-65yr Insurance coverage will need to be confirmed)  11/30/2018   COVID-19 Vaccine (2 - Moderna risk series) 04/08/2020   DTaP/Tdap/Td (5 - Td or Tdap) 07/05/2031   Pneumonia Vaccine 66 Years old  Completed   INFLUENZA VACCINE  Completed   Hepatitis C Screening  Completed   HIV Screening  Completed  Zoster Vaccines- Shingrix  Completed   HPV VACCINES  Aged Out    Health Maintenance  Health Maintenance Due  Topic Date Due   Medicare Annual Wellness (AWV)  Never done   COLONOSCOPY (Pts 45-77yr Insurance coverage will need to be confirmed)  11/30/2018   COVID-19 Vaccine (2 - Moderna risk series) 04/08/2020    Colorectal cancer screening: Type of screening: Colonoscopy. Completed 12/01/2015. Repeat every 7 years  Lung Cancer Screening: (Low Dose CT Chest recommended if Age 66-80years, 30 pack-year currently smoking OR  have quit w/in 15years.) does not qualify.   Lung Cancer Screening Referral: no  Additional Screening:  Hepatitis C Screening: does qualify; Completed 07/05/2022  Vision Screening: Recommended annual ophthalmology exams for early detection of glaucoma and other disorders of the eye. Is the patient up to date with their annual eye exam?  No  Who is the provider or what is the name of the office in which the patient attends annual eye exams? none If pt is not established with a provider, would they like to be referred to a provider to establish care? No .   Dental Screening: Recommended annual dental exams for proper oral hygiene  Community Resource Referral / Chronic Care Management: CRR required this visit?  No   CCM required this visit?  No      Plan:     I have personally reviewed and noted the following in the patient's chart:   Medical and social history Use of alcohol, tobacco or illicit drugs  Current medications and supplements including opioid prescriptions. Patient is not currently taking opioid prescriptions. Functional ability and status Nutritional status Physical activity Advanced directives List of other physicians Hospitalizations, surgeries, and ER visits in previous 12 months Vitals Screenings to include cognitive, depression, and falls Referrals and appointments  In addition, I have reviewed and discussed with patient certain preventive protocols, quality metrics, and best practice recommendations. A written personalized care plan for preventive services as well as general preventive health recommendations were provided to patient.     NKellie Simmering LPN   [id number]  Nurse Notes: none  Due to this being a virtual visit, the after visit summary with patients personalized plan was offered to patient via mail or my-chart.  to pick up at office at next visit

## 2022-11-27 ENCOUNTER — Ambulatory Visit: Payer: Medicare Other | Admitting: Urology

## 2022-12-02 ENCOUNTER — Encounter: Payer: Self-pay | Admitting: Gastroenterology

## 2022-12-12 ENCOUNTER — Encounter: Payer: Self-pay | Admitting: Certified Registered Nurse Anesthetist

## 2022-12-13 ENCOUNTER — Ambulatory Visit (AMBULATORY_SURGERY_CENTER): Payer: Medicare Other | Admitting: Gastroenterology

## 2022-12-13 ENCOUNTER — Encounter: Payer: Self-pay | Admitting: Gastroenterology

## 2022-12-13 VITALS — BP 113/77 | HR 82 | Temp 98.2°F | Resp 10 | Ht 72.5 in | Wt 270.0 lb

## 2022-12-13 DIAGNOSIS — D123 Benign neoplasm of transverse colon: Secondary | ICD-10-CM

## 2022-12-13 DIAGNOSIS — E785 Hyperlipidemia, unspecified: Secondary | ICD-10-CM | POA: Diagnosis not present

## 2022-12-13 DIAGNOSIS — D120 Benign neoplasm of cecum: Secondary | ICD-10-CM | POA: Diagnosis not present

## 2022-12-13 DIAGNOSIS — Z86010 Personal history of colonic polyps: Secondary | ICD-10-CM

## 2022-12-13 DIAGNOSIS — I10 Essential (primary) hypertension: Secondary | ICD-10-CM | POA: Diagnosis not present

## 2022-12-13 DIAGNOSIS — Z09 Encounter for follow-up examination after completed treatment for conditions other than malignant neoplasm: Secondary | ICD-10-CM

## 2022-12-13 MED ORDER — SODIUM CHLORIDE 0.9 % IV SOLN: 500.0000 mL | Freq: Once | INTRAVENOUS | Status: DC

## 2022-12-13 NOTE — Progress Notes (Signed)
Called to room to assist during endoscopic procedure.  Patient ID and intended procedure confirmed with present staff. Received instructions for my participation in the procedure from the performing physician.  

## 2022-12-13 NOTE — Progress Notes (Signed)
[facility] History and Physical   Primary Care Physician:  Carlena Hurl, PA-C   Reason for Procedure:   History of colon polyps  Plan:    colonoscopy     HPI: Charles Moore is a 67 y.o. male  here for colonoscopy surveillance - 4 adenomas removed 11/2015.   Patient denies any bowel symptoms at this time. No family history of colon cancer known. Otherwise feels well without any cardiopulmonary symptoms.   I have discussed risks / benefits of anesthesia and endoscopic procedure with [name] and they wish to proceed with the exams as outlined today.    Past Medical History:  Diagnosis Date   Allergy    seasonal with rag weed and pollen    Asthma    rare wheezing   Cancer ([place])    History of kidney stones    Hyperlipidemia    Hypertension 10/14/2008   Nut allergy    Obesity    Wears glasses     Past Surgical History:  Procedure Laterality Date   CHOLECYSTECTOMY N/A 01/25/2022   Procedure: LAPAROSCOPIC CHOLECYSTECTOMY;  Surgeon: Rusty Aus, DO;  Location: [facility];  Service: General;  Laterality: N/A;   COLONOSCOPY  11/2015   polyps, Dr. [place] Cellar   LITHOTRIPSY     PROSTATE BIOPSY      Prior to Admission medications   Medication Sig Start Date End Date Taking? Authorizing Provider  Ascorbic Acid (VITAMIN C) 1000 MG tablet Take 1,000 mg by mouth daily.   Yes [provider]  finasteride (PROSCAR) 5 MG tablet TAKE ONE TABLET BY MOUTH DAILY 08/15/22  Yes McKenzie, Candee Furbish, MD  L-Arginine 500 MG CAPS Take 500 mg by mouth daily.   Yes [provider]  lisinopril-hydrochlorothiazide (ZESTORETIC) 20-25 MG tablet Take 1 tablet by mouth daily. 07/06/22  Yes Tysinger, Camelia Eng, PA-C  metoprolol succinate (TOPROL-XL) 50 MG 24 hr tablet TAKE 1 TABLET BY MOUTH DAILY WITH OR IMMEDIATELY FOLLOWING A MEAL 09/10/22  Yes Tysinger, Camelia Eng, PA-C  Multiple Vitamins-Minerals (MULTIVITAMIN WITH MINERALS) tablet Take 1 tablet by  mouth daily.   Yes [provider]  rosuvastatin (CRESTOR) 20 MG tablet Take 1 tablet (20 mg total) by mouth daily. 07/06/22  Yes Tysinger, Camelia Eng, PA-C    Current Outpatient Medications  Medication Sig Dispense Refill   Ascorbic Acid (VITAMIN C) 1000 MG tablet Take 1,000 mg by mouth daily.     finasteride (PROSCAR) 5 MG tablet TAKE ONE TABLET BY MOUTH DAILY 90 tablet 3   L-Arginine 500 MG CAPS Take 500 mg by mouth daily.     lisinopril-hydrochlorothiazide (ZESTORETIC) 20-25 MG tablet Take 1 tablet by mouth daily. 90 tablet 3   metoprolol succinate (TOPROL-XL) 50 MG 24 hr tablet TAKE 1 TABLET BY MOUTH DAILY WITH OR IMMEDIATELY FOLLOWING A MEAL 90 tablet 3   Multiple Vitamins-Minerals (MULTIVITAMIN WITH MINERALS) tablet Take 1 tablet by mouth daily.     rosuvastatin (CRESTOR) 20 MG tablet Take 1 tablet (20 mg total) by mouth daily. 90 tablet 3   Current Facility-Administered Medications  Medication Dose Route Frequency Provider Last Rate Last Admin   0.9 %  sodium chloride infusion  500 mL Intravenous Once Lilyanah Celestin, Carlota Raspberry, MD        Allergies as of 12/13/2022 - Review Complete 12/13/2022  Allergen Reaction Noted   Other Anaphylaxis 09/27/2013   Neosporin original [neomycin-bacitracin zn-polymyx] Rash 01/25/2022    Family History  Problem Relation Age of  Onset   Other Mother        pneumonia   Cancer Father        lung   Cancer Brother        lung   Alcohol abuse Brother    Heart disease Brother 30       CABG   Hypertension Brother    Colon polyps Brother    Colon cancer Paternal Grandfather    Esophageal cancer Neg Hx    Rectal cancer Neg Hx    Stomach cancer Neg Hx     Social History   Socioeconomic History   Marital status: Married    Spouse name: Not on file   Number of children: 1   Years of education: Not on file   Highest education level: Not on file  Occupational History   Occupation: Retired Librarian, academic [employer]  Tobacco Use   Smoking status:  Never   Smokeless tobacco: Never  Vaping Use   Vaping Use: Never used  Substance and Sexual Activity   Alcohol use: Not Currently    Comment: 1 beer a month   Drug use: No   Sexual activity: Yes  Other Topics Concern   Not on file  Social History Narrative   Married, 1 child, 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  Exercise - with walking.   06/2022   Social Determinants of Health   Financial Resource Strain: Low Risk  (11/26/2022)   Overall Financial Resource Strain (CARDIA)    Difficulty of Paying Living Expenses: Not hard at all  Food Insecurity: No Food Insecurity (11/26/2022)   Hunger Vital Sign    Worried About Running Out of Food in the Last Year: Never true    Ran Out of Food in the Last Year: Never true  Transportation Needs: No Transportation Needs (11/26/2022)   PRAPARE - Hydrologist (Medical): No    Lack of Transportation (Non-Medical): No  Physical Activity: Inactive (11/26/2022)   Exercise Vital Sign    Days of Exercise per Week: 0 days    Minutes of Exercise per Session: 0 min  Stress: No Stress Concern Present (11/26/2022)   [place]    Feeling of Stress : Not at all  Social Connections: Not on file  Intimate Partner Violence: Not on file    Review of Systems: All other review of systems negative except as mentioned in the HPI.  Physical Exam: Vital signs BP (!) 134/90   Pulse 82   Temp 98.2 F (36.8 C) (Temporal)   Ht 6' 0.5" (1.842 m)   Wt 270 lb (122.5 kg)   SpO2 98%   BMI 36.12 kg/m   General:   Alert,  Well-developed, pleasant and cooperative in NAD Lungs:  Clear throughout to auscultation.   Heart:  Regular rate and rhythm Abdomen:  Soft, nontender and nondistended.   Neuro/Psych:  Alert and cooperative. Normal mood and affect. A and O x 3  Jolly Mango, MD [facility]

## 2022-12-13 NOTE — Op Note (Signed)
[facility] Patient Name: Charles Moore Procedure Date: 12/13/2022 8:27 AM MRN: [id number] Endoscopist: Remo Lipps P. Havery Moros , MD, [id number] Age: 67 Referring MD:  Date of Birth: [birth year] Gender: Male Account #: [id number] Procedure:                Colonoscopy Indications:              High risk colon cancer surveillance: Personal                            history of colonic polyps - 4 adenomas removee                            11/2015 Medicines:                Monitored Anesthesia Care Procedure:                Pre-Anesthesia Assessment:                           - Prior to the procedure, a History and Physical                            was performed, and patient medications and                            allergies were reviewed. The patient's tolerance of                            previous anesthesia was also reviewed. The risks                            and benefits of the procedure and the sedation                            options and risks were discussed with the patient.                            All questions were answered, and informed consent                            was obtained. Prior Anticoagulants: The patient has                            taken no anticoagulant or antiplatelet agents. ASA                            Grade Assessment: II - A patient with mild systemic                            disease. After reviewing the risks and benefits,                            the patient was deemed in satisfactory condition to  undergo the procedure.                           After obtaining informed consent, the colonoscope                            was passed under direct vision. Throughout the                            procedure, the patient's blood pressure, pulse, and                            oxygen saturations were monitored continuously. The                            CF [id number] was introduced through the anus                             and advanced to the the cecum, identified by                            appendiceal orifice and ileocecal valve. The                            colonoscopy was performed without difficulty. The                            patient tolerated the procedure well. The quality                            of the bowel preparation was good. The ileocecal                            valve, appendiceal orifice, and rectum were                            photographed. Scope In: 8:29:40 AM Scope Out: 8:43:20 AM Scope Withdrawal Time: 0 hours 11 minutes 51 seconds  Total Procedure Duration: 0 hours 13 minutes 40 seconds  Findings:                 The perianal and digital rectal examinations were                            normal.                           A 3 mm polyp was found in the ileocecal valve. The                            polyp was sessile. The polyp was removed with a                            cold snare. Resection and retrieval were complete.  A 3 mm polyp was found in the transverse colon. The                            polyp was sessile. The polyp was removed with a                            cold snare. Resection and retrieval were complete.                           A 3 mm polyp was found in the splenic flexure. The                            polyp was sessile. The polyp was removed with a                            cold snare. Resection and retrieval were complete.                           Internal hemorrhoids were found during retroflexion.                           The exam was otherwise without abnormality. Complications:            No immediate complications. Estimated blood loss:                            Minimal. Estimated Blood Loss:     Estimated blood loss was minimal. Impression:               - One 3 mm polyp at the ileocecal valve, removed                            with a cold snare. Resected and retrieved.                            - One 3 mm polyp in the transverse colon, removed                            with a cold snare. Resected and retrieved.                           - One 3 mm polyp at the splenic flexure, removed                            with a cold snare. Resected and retrieved.                           - Internal hemorrhoids.                           - The examination was otherwise normal. Recommendation:           - Patient has a contact number available for  emergencies. The signs and symptoms of potential                            delayed complications were discussed with the                            patient. Return to normal activities tomorrow.                            Written discharge instructions were provided to the                            patient.                           - Resume previous diet.                           - Continue present medications.                           - Await pathology results. Remo Lipps P. Mayar Whittier, MD 12/13/2022 8:47:29 AM This report has been signed electronically.

## 2022-12-13 NOTE — Patient Instructions (Addendum)
Information on polyps and hemorrhoids given to you today.  Await pathology results.  Resume previous diet and medications.   YOU HAD AN ENDOSCOPIC PROCEDURE TODAY AT [facility]:   Refer to the procedure report that was given to you for any specific questions about what was found during the examination.  If the procedure report does not answer your questions, please call your gastroenterologist to clarify.  If you requested that your care partner not be given the details of your procedure findings, then the procedure report has been included in a sealed envelope for you to review at your convenience later.  YOU SHOULD EXPECT: Some feelings of bloating in the abdomen. Passage of more gas than usual.  Walking can help get rid of the air that was put into your GI tract during the procedure and reduce the bloating. If you had a lower endoscopy (such as a colonoscopy or flexible sigmoidoscopy) you may notice spotting of blood in your stool or on the toilet paper. If you underwent a bowel prep for your procedure, you may not have a normal bowel movement for a few days.  Please Note:  You might notice some irritation and congestion in your nose or some drainage.  This is from the oxygen used during your procedure.  There is no need for concern and it should clear up in a day or so.  SYMPTOMS TO REPORT IMMEDIATELY:  Following lower endoscopy (colonoscopy or flexible sigmoidoscopy):  Excessive amounts of blood in the stool  Significant tenderness or worsening of abdominal pains  Swelling of the abdomen that is new, acute  Fever of 100F or higher   For urgent or emergent issues, a gastroenterologist can be reached at any hour by calling [phone]. Do not use MyChart messaging for urgent concerns.    DIET:  We do recommend a small meal at first, but then you may proceed to your regular diet.  Drink plenty of fluids but you should avoid alcoholic beverages for 24  hours.  ACTIVITY:  You should plan to take it easy for the rest of today and you should NOT DRIVE or use heavy machinery until tomorrow (because of the sedation medicines used during the test).    FOLLOW UP: Our staff will call the number listed on your records the next business day following your procedure.  We will call around 7:15- 8:00 am to check on you and address any questions or concerns that you may have regarding the information given to you following your procedure. If we do not reach you, we will leave a message.     If any biopsies were taken you will be contacted by phone or by letter within the next 1-3 weeks.  Please call us at [phone] if you have not heard about the biopsies in 3 weeks.    SIGNATURES/CONFIDENTIALITY: You and/or your care partner have signed paperwork which will be entered into your electronic medical record.  These signatures attest to the fact that that the information above on your After Visit Summary has been reviewed and is understood.  Full responsibility of the confidentiality of this discharge information lies with you and/or your care-partner.

## 2022-12-13 NOTE — Progress Notes (Signed)
Late entry 0840 HR > 100 with esmolol 25 mg given IV, MD updated, vss

## 2022-12-13 NOTE — Progress Notes (Signed)
Report given to PACU, vss 

## 2022-12-13 NOTE — Progress Notes (Signed)
VS completed by CW.   Pt's states no medical or surgical changes since previsit or office visit.  

## 2022-12-16 ENCOUNTER — Telehealth: Payer: Self-pay | Admitting: *Deleted

## 2022-12-16 NOTE — Telephone Encounter (Signed)
  Follow up Call-     12/13/2022    7:50 AM  Call back number  Post procedure Call Back phone  # [phone]  Permission to leave phone message Yes     Patient questions:    Message left to call us if necessary.

## 2022-12-22 ENCOUNTER — Encounter: Payer: Self-pay | Admitting: Gastroenterology

## 2022-12-24 ENCOUNTER — Ambulatory Visit: Payer: Medicare Other | Admitting: Urology

## 2022-12-31 ENCOUNTER — Ambulatory Visit: Payer: Medicare Other | Admitting: Urology

## 2023-02-24 DIAGNOSIS — H2513 Age-related nuclear cataract, bilateral: Secondary | ICD-10-CM | POA: Diagnosis not present

## 2023-02-24 DIAGNOSIS — H40033 Anatomical narrow angle, bilateral: Secondary | ICD-10-CM | POA: Diagnosis not present

## 2023-04-30 IMAGING — CT CT ABD-PELV W/ CM
2 of 5 series · 15 of 46 positions shown, 17 images · IV contrast (Omnipaque or Isovue)
Comparison: CT the abdomen and pelvis 01/02/2009.

CLINICAL DATA: 66-year-old male with history of upper abdominal
pain since yesterday evening.

EXAM:
CT ABDOMEN AND PELVIS WITH CONTRAST
TECHNIQUE: Multidetector CT imaging of the abdomen and pelvis was performed
using the standard protocol following bolus administration of
intravenous contrast.

[Series 2: axial st · axial · 0.93mm/px · z∈[+636,+1116]mm · 12 of 109 slices shown, 14 images]
[im 7/109  soft-tissue]
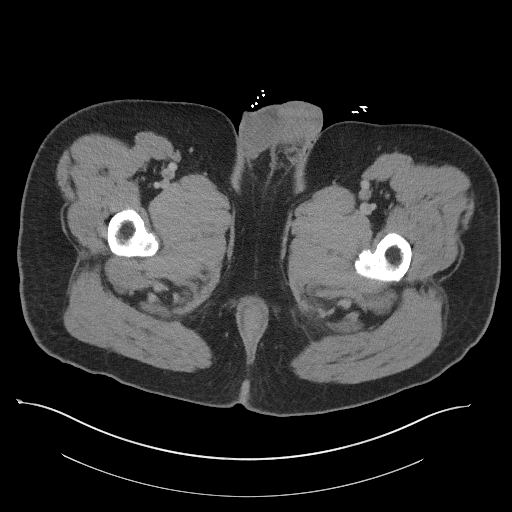
[im 7/109  bone]
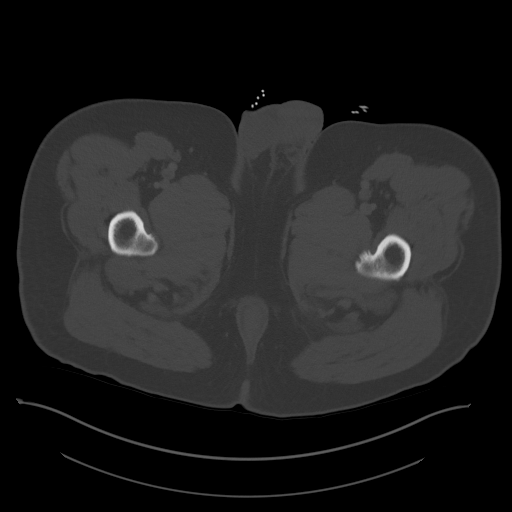
[im 19/109  soft-tissue]
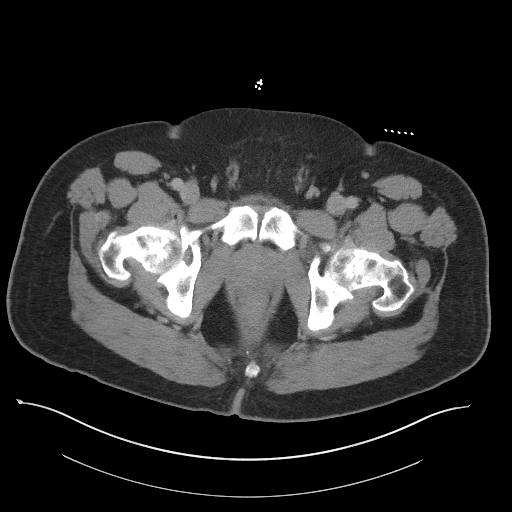
[im 25/109  soft-tissue]
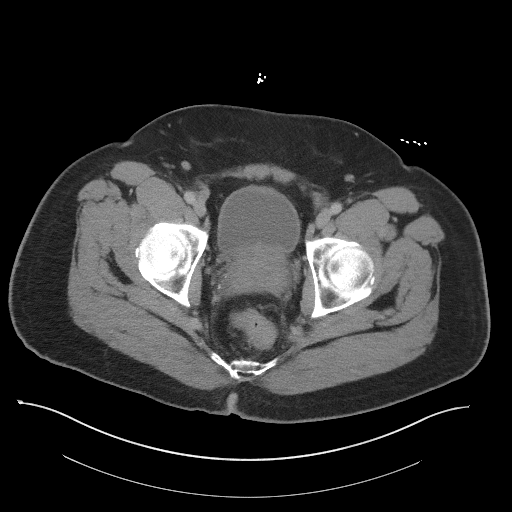
[im 31/109  soft-tissue]
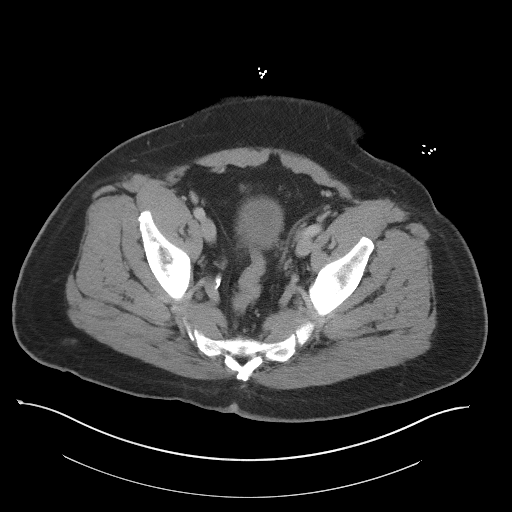
[im 43/109  soft-tissue]
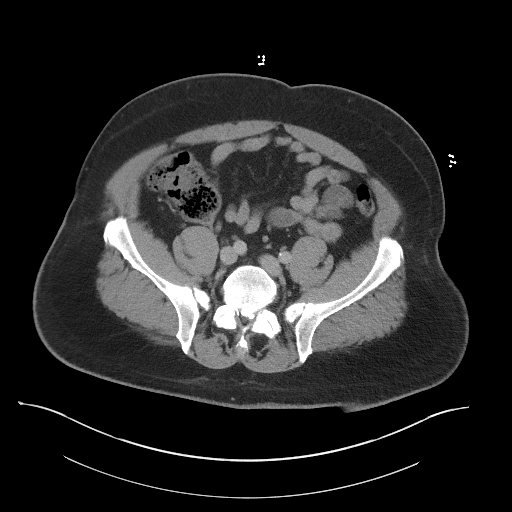
[im 49/109  soft-tissue]
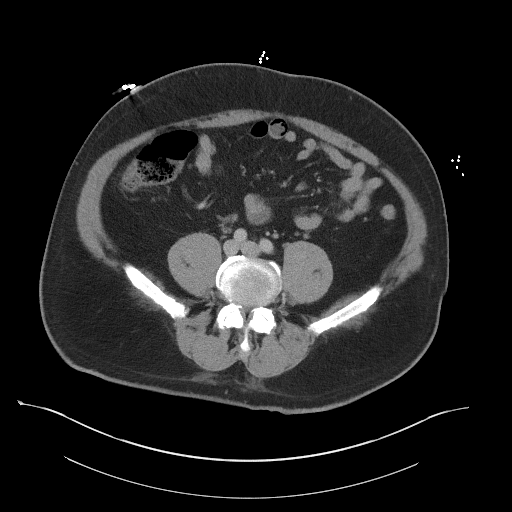
[im 61/109  soft-tissue]
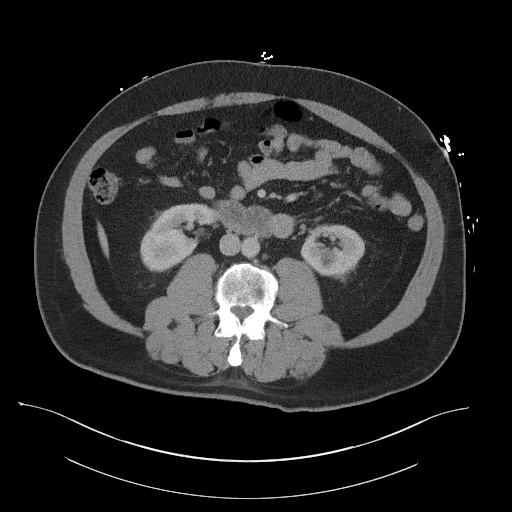
[im 67/109  soft-tissue]
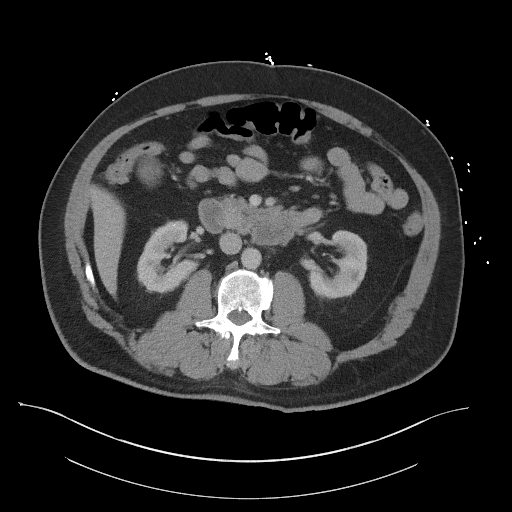
[im 79/109  soft-tissue]
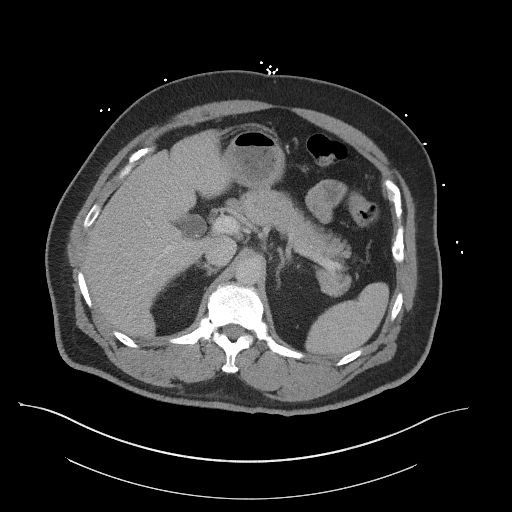
[im 79/109  bone]
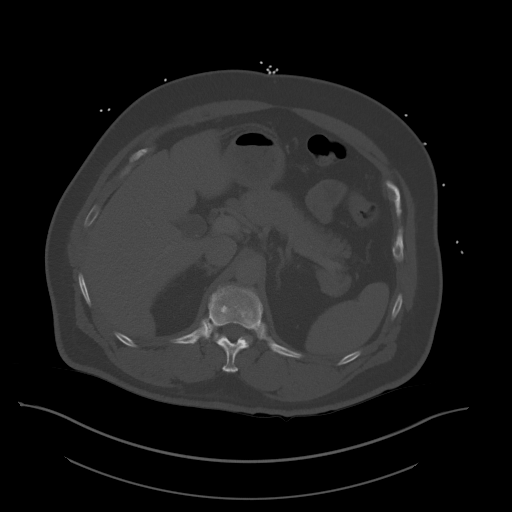
[im 85/109  soft-tissue]
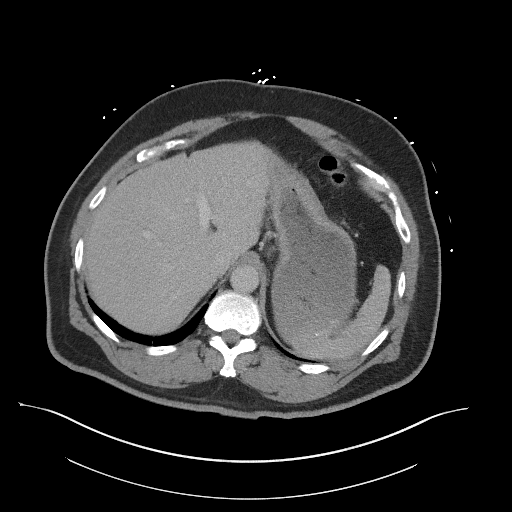
[im 91/109  soft-tissue]
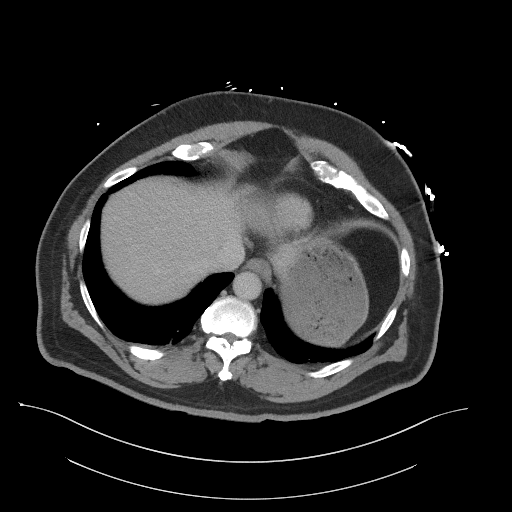
[im 103/109  soft-tissue]
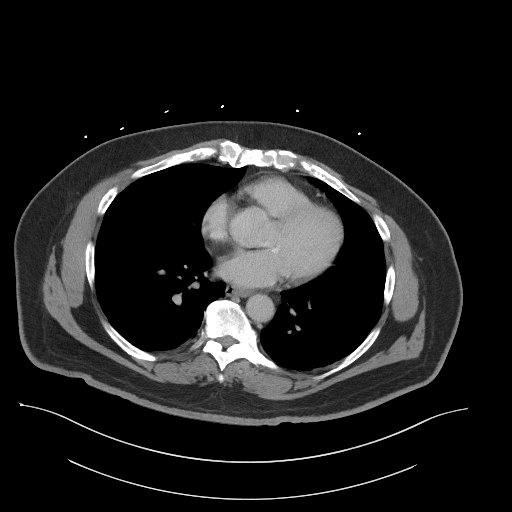

[Series 5: coronal st · coronal · 0.95mm/px · 3 of 129 slices shown]
[im 43/129  soft-tissue]
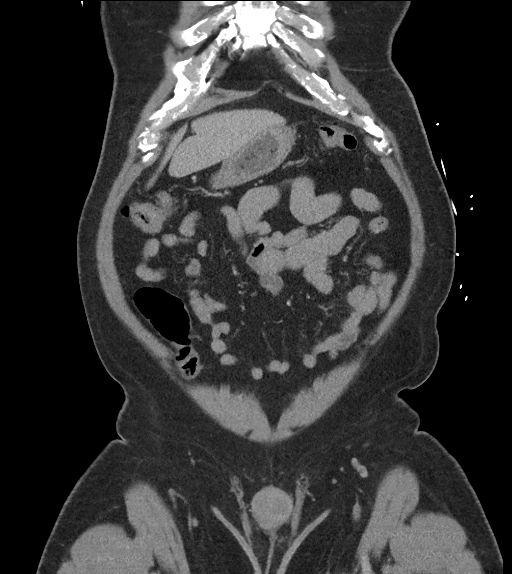
[im 57/129  soft-tissue]
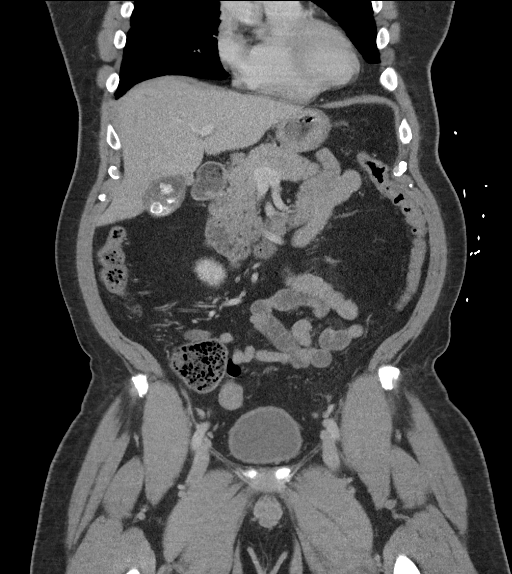
[im 72/129  soft-tissue]
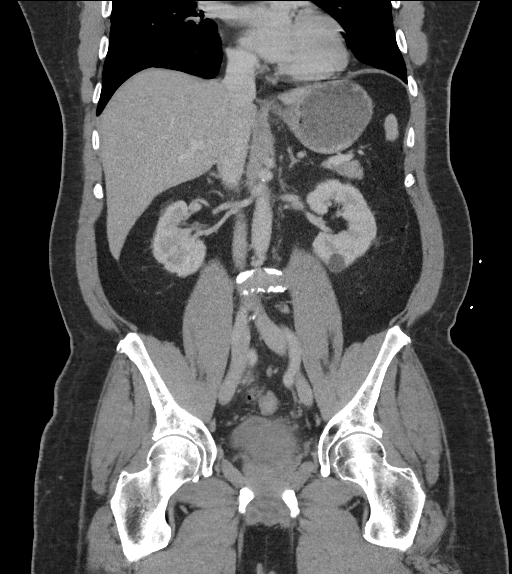

[15 of 46 positions shown; findings below may reference images not displayed]

RADIATION DOSE REDUCTION: This exam was performed according to the
departmental dose-optimization program which includes automated
exposure control, adjustment of the mA and/or kV according to
patient size and/or use of iterative reconstruction technique.

CONTRAST:  100mL OMNIPAQUE IOHEXOL 300 MG/ML  SOLN
FINDINGS: Lower chest: Linear scarring or atelectasis is noted in the lung
bases bilaterally (left-greater-than-right). Atherosclerotic
calcifications in the descending thoracic aorta as well as the left
anterior descending coronary artery. Calcifications of the aortic
valve and mitral annulus.

Hepatobiliary: No suspicious cystic or solid hepatic lesions. No
intra or extrahepatic biliary ductal dilatation. Numerous calcified
gallstones are noted within the lumen of the gallbladder. There is
also some amorphous high attenuation material in the gallbladder,
presumably biliary sludge. Gallbladder is only moderately distended.
No definite gallbladder wall thickening or pericholecystic fluid or
surrounding inflammatory changes are noted to suggest an acute
cholecystitis at this time.

Pancreas: No pancreatic mass. No pancreatic ductal dilatation. No
pancreatic or peripancreatic fluid collections or inflammatory
changes.

Spleen: Unremarkable.

Adrenals/Urinary Tract: Nonobstructive calculi are noted within both
of the renal collecting systems, largest of which measures 3 mm in
the upper pole collecting system of the right kidney. 2.1 cm
low-attenuation lesion in the lower pole of the left kidney,
compatible with a simple cyst. No aggressive appearing renal
lesions. No hydroureteronephrosis. Urinary bladder is unremarkable
in appearance. Bilateral adrenal glands are normal in appearance.

Stomach/Bowel: The appearance of the stomach is normal. No
pathologic dilatation of small bowel or colon. Normal appendix.

Vascular/Lymphatic: Aortic atherosclerosis, without evidence of
aneurysm or dissection in the abdominal or pelvic vasculature. No
lymphadenopathy noted in the abdomen or pelvis.

Reproductive: Prostate gland and seminal vesicles are unremarkable
in appearance.

Other: No significant volume of ascites.  No pneumoperitoneum.

Musculoskeletal: There are no aggressive appearing lytic or blastic
lesions noted in the visualized portions of the skeleton.
IMPRESSION: 1. Cholelithiasis and biliary sludge in the gallbladder. No
definitive imaging findings to suggest an acute cholecystitis at
this time.
2. Nonobstructive calculi in the collecting systems of both kidneys
measuring up to 3 mm in the upper pole collecting system of the
right kidney. No ureteral stones or findings of urinary tract
obstruction.
3. Aortic atherosclerosis.
4. Additional incidental findings, as above.

## 2023-06-23 DIAGNOSIS — N4289 Other specified disorders of prostate: Secondary | ICD-10-CM | POA: Diagnosis not present

## 2023-06-23 DIAGNOSIS — R972 Elevated prostate specific antigen [PSA]: Secondary | ICD-10-CM | POA: Diagnosis not present

## 2023-06-23 DIAGNOSIS — C61 Malignant neoplasm of prostate: Secondary | ICD-10-CM | POA: Diagnosis not present

## 2023-06-30 DIAGNOSIS — N5201 Erectile dysfunction due to arterial insufficiency: Secondary | ICD-10-CM | POA: Diagnosis not present

## 2023-06-30 DIAGNOSIS — C61 Malignant neoplasm of prostate: Secondary | ICD-10-CM | POA: Diagnosis not present

## 2023-07-14 ENCOUNTER — Ambulatory Visit (INDEPENDENT_AMBULATORY_CARE_PROVIDER_SITE_OTHER): Payer: Medicare HMO | Admitting: Medical

## 2023-07-14 ENCOUNTER — Encounter: Payer: Self-pay | Admitting: Medical

## 2023-07-14 VITALS — BP 120/72 | HR 86 | Ht 69.5 in | Wt 270.0 lb

## 2023-07-14 DIAGNOSIS — Z87892 Personal history of anaphylaxis: Secondary | ICD-10-CM | POA: Diagnosis not present

## 2023-07-14 DIAGNOSIS — Z0000 Encounter for general adult medical examination without abnormal findings: Secondary | ICD-10-CM

## 2023-07-14 DIAGNOSIS — I10 Essential (primary) hypertension: Secondary | ICD-10-CM | POA: Diagnosis not present

## 2023-07-14 DIAGNOSIS — Z8546 Personal history of malignant neoplasm of prostate: Secondary | ICD-10-CM | POA: Diagnosis not present

## 2023-07-14 DIAGNOSIS — E785 Hyperlipidemia, unspecified: Secondary | ICD-10-CM | POA: Diagnosis not present

## 2023-07-14 DIAGNOSIS — R809 Proteinuria, unspecified: Secondary | ICD-10-CM | POA: Diagnosis not present

## 2023-07-14 DIAGNOSIS — Z131 Encounter for screening for diabetes mellitus: Secondary | ICD-10-CM

## 2023-07-14 LAB — POCT URINALYSIS DIP (PROADVANTAGE DEVICE)
Bilirubin, UA: NEGATIVE
Blood, UA: NEGATIVE
Glucose, UA: NEGATIVE mg/dL
Ketones, POC UA: NEGATIVE mg/dL
Nitrite, UA: NEGATIVE
Protein Ur, POC: NEGATIVE mg/dL
Specific Gravity, Urine: 1.02
Urobilinogen, Ur: NEGATIVE
pH, UA: 6 (ref 5.0–8.0)

## 2023-07-14 NOTE — Progress Notes (Signed)
Complete physical exam   Patient: Charles Moore   DOB: Sep 20, 1956   67 y.o. Male  MRN: [id number] Visit Date: 07/14/2023  Chief Complaint  Patient presents with   Annual Exam    Fasting cpe, already had shots done on Friday at [facility], RSV, FLU, Pneum 20 and Covid    Care Team Dr. Norlene Campbell, ortho Dr. Ileene Patrick, GI Dr. Wilkie Aye, urology Dr. Margaretmary Dys, radiation oncology Dr. Lennie Odor, cardiology Ahmaya Ostermiller, Kermit Balo, PA-C here for primary care   Concerns: Been doing well.  He just had some updated biopsies from urology recently.  He has 1 particular area that still area of concern but otherwise biopsies reportedly were good.  He is compliant with his medications in general without complaint    Past Medical History:  Diagnosis Date   Allergy    seasonal with rag weed and pollen    Asthma    rare wheezing   Cancer (HCC) 2022   prostate   History of kidney stones    Hyperlipidemia    Hypertension 10/14/2008   Nut allergy    Obesity    Wears glasses    Past Surgical History:  Procedure Laterality Date   CHOLECYSTECTOMY N/A 01/25/2022   Procedure: LAPAROSCOPIC CHOLECYSTECTOMY;  Surgeon: Lewie Chamber, DO;  Location: [facility];  Service: General;  Laterality: N/A;   COLONOSCOPY  11/2015   polyps, Dr. Ileene Patrick   LITHOTRIPSY     PROSTATE BIOPSY      Family Status  Relation Name Status   Mother  Deceased   Father  Deceased   Brother percy Deceased   Brother seth jr Alive   Brother OC Alive   Sister patricia Alive   Brother steve Alive   Brother melvin Alive   PGF  (Not Specified)   Neg Hx  (Not Specified)  No partnership data on file   Family History  Problem Relation Age of Onset   Other Mother        pneumonia   Cancer Father        lung   Cancer Brother        lung   Alcohol abuse Brother    Heart disease Brother 29       CABG   Hypertension Brother    Colon polyps Brother    Colon  cancer Paternal Grandfather    Esophageal cancer Neg Hx    Rectal cancer Neg Hx    Stomach cancer Neg Hx    Allergies  Allergen Reactions   Other Anaphylaxis    Almonds, cashews, [place] nuts   Neosporin Original [Neomycin-Bacitracin Zn-Polymyx] Rash    Patient Care Team: Doniven Vanpatten, Cleda Mccreedy as PCP - General (Family Medicine)   Medications: Outpatient Medications Prior to Visit  Medication Sig   Ascorbic Acid (VITAMIN C) 1000 MG tablet Take 1,000 mg by mouth daily.   finasteride (PROSCAR) 5 MG tablet TAKE ONE TABLET BY MOUTH DAILY   L-Arginine 500 MG CAPS Take 500 mg by mouth daily.   lisinopril-hydrochlorothiazide (ZESTORETIC) 20-25 MG tablet Take 1 tablet by mouth daily.   metoprolol succinate (TOPROL-XL) 50 MG 24 hr tablet TAKE 1 TABLET BY MOUTH DAILY WITH OR IMMEDIATELY FOLLOWING A MEAL   Multiple Vitamins-Minerals (MULTIVITAMIN WITH MINERALS) tablet Take 1 tablet by mouth daily.   rosuvastatin (CRESTOR) 20 MG tablet Take 1 tablet (20 mg total) by mouth daily.   No facility-administered medications prior to visit.  Review of Systems Review of Systems Constitutional: -fever, -chills, -sweats, -unexpected weight change, -anorexia, -fatigue Allergy: -sneezing, -itching, -congestion Dermatology: denies changing moles, rash, lumps, new worrisome lesions ENT: -runny nose, -ear pain, -sore throat, -hoarseness, -sinus pain, -teeth pain, -tinnitus, -hearing loss, -epistaxis Cardiology:  -chest pain, -palpitations, -edema, -orthopnea, -paroxysmal nocturnal dyspnea Respiratory: -cough, -shortness of breath, -dyspnea on exertion, -wheezing, -hemoptysis Gastroenterology: -abdominal pain, -nausea, -vomiting, -diarrhea, -constipation, -blood in stool, -changes in bowel movement, -dysphagia Hematology: -bleeding or bruising problems Musculoskeletal: -arthralgias, -myalgias, -joint swelling, -back pain, -neck pain, -cramping, -gait changes Ophthalmology: -vision changes, -eye redness,  -itching, -discharge Urology: -dysuria, -difficulty urinating, -hematuria, -urinary frequency, -urgency, incontinence Neurology: -headache, -weakness, -tingling, -numbness, -speech abnormality, -memory loss, -falls, -dizziness Psychology:  -depressed mood, -agitation, -sleep problems     Objective    BP 120/72   Pulse 86   Ht 5' 9.5" (1.765 m)   Wt 270 lb (122.5 kg)   BMI 39.30 kg/m   BP Readings from Last 3 Encounters:  07/14/23 120/72  12/13/22 113/77  07/05/22 114/70   Wt Readings from Last 3 Encounters:  07/14/23 270 lb (122.5 kg)  12/13/22 270 lb (122.5 kg)  11/26/22 270 lb (122.5 kg)    General appearence: alert, no distress, WD/WN, African American male HEENT: normocephalic, sclerae anicteric, PERRLA, EOMi Neck: supple, no lymphadenopathy, no thyromegaly, no masses, no bruits Heart: RRR, normal S1, S2, no murmurs Lungs: CTA bilaterally, no wheezes, rhonchi, or rales Abdomen: +bs, soft, non tender, non distended, no masses, no hepatomegaly, no splenomegaly Back: non tender Musculoskeletal: nontender, no swelling, no obvious deformity Extremities: no edema, no cyanosis, no clubbing Pulses: 2+ symmetric, upper and lower extremities, normal cap refill Neurological: alert, oriented x 3, CN2-12 intact, strength normal upper extremities and lower extremities, sensation normal throughout, DTRs 2+ throughout, no cerebellar signs, gait normal Psychiatric: normal affect, behavior normal, pleasant  GU/rectal - deferred to urology    Last depression screening scores    07/14/2023   10:37 AM 11/26/2022    3:32 PM 07/05/2022    8:47 AM  PHQ 2/9 Scores  PHQ - 2 Score 0 0 0  PHQ- 9 Score  0    Last fall risk screening    07/14/2023   10:37 AM  Fall Risk   Falls in the past year? 0  Number falls in past yr: 0  Injury with Fall? 0  Risk for fall due to : No Fall Risks  Follow up Falls evaluation completed      Assessment & Plan   Encounter Diagnoses  Name Primary?    Routine general medical examination at a health care facility Yes   Screening for diabetes mellitus    Microalbuminuria    History of anaphylaxis    Essential hypertension    Dyslipidemia    History of prostate cancer      This visit was a preventative care visit, also known as wellness visit or routine physical.   Topics typically include healthy lifestyle, diet, exercise, preventative care, vaccinations, sick and well care, proper use of emergency dept and after hours care, as well as other concerns.     Recommendations: Continue to return yearly for your annual wellness and preventative care visits.  This gives [place] a chance to discuss healthy lifestyle, exercise, vaccinations, review your chart record, and perform screenings where appropriate.  I recommend you see your eye doctor yearly for routine vision care.  I recommend you see your dentist yearly for routine dental care including  hygiene visits twice yearly.   Vaccination recommendations were reviewed Immunization History  Administered Date(s) Administered   DTaP 04/07/1958, 05/26/1958   Fluad Quad(high Dose 65+) 07/05/2022   Hepatitis A 08/08/2019, 01/11/2020   Hepatitis B 08/08/2019, 01/02/2020, 05/10/2020   Influenza,inj,Quad PF,6+ Mos 07/24/2020, 07/04/2021   Influenza-Unspecified 07/03/2015, 07/11/2023   Moderna Sars-Covid-2 Vaccination 03/11/2020   PNEUMOCOCCAL CONJUGATE-20 07/05/2022, 07/11/2023   Pfizer(Comirnaty)Fall Seasonal Vaccine 12 years and older 07/11/2023   RSV,unspecified 07/11/2023   Td 07/04/2021   Tdap 08/15/2011   Zoster Recombinant(Shingrix) 08/08/2019, 01/02/2020    Screening for cancer: Colon cancer screening: Reviewed 12/2022 colonsocpy.  Repeat in 7 years.  Skin cancer screening: Check your skin regularly for new changes, growing lesions, or other lesions of concern Come in for evaluation if you have skin lesions of concern.  Lung cancer screening: If you have a greater than 20 pack  year history of tobacco use, then you may qualify for lung cancer screening with a chest CT scan.   Please call your insurance company to inquire about coverage for this test.  We currently don't have screenings for other cancers besides breast, cervical, colon, and lung cancers.  If you have a strong family history of cancer or have other cancer screening concerns, please let me know.    Bone health: Get at least 150 minutes of aerobic exercise weekly Get weight bearing exercise at least once weekly Bone density test:  A bone density test is an imaging test that uses a type of X-ray to measure the amount of calcium and other minerals in your bones. The test may be used to diagnose or screen you for a condition that causes weak or thin bones (osteoporosis), predict your risk for a broken bone (fracture), or determine how well your osteoporosis treatment is working. The bone density test is recommended for females 65 and older, or females or males <65 if certain risk factors such as thyroid disease, long term use of steroids such as for asthma or rheumatological issues, vitamin D deficiency, estrogen deficiency, family history of osteoporosis, self or family history of fragility fracture in first degree relative.    Heart health: Get at least 150 minutes of aerobic exercise weekly Limit alcohol It is important to maintain a healthy blood pressure and healthy cholesterol numbers  Heart disease screening: Screening for heart disease includes screening for blood pressure, fasting lipids, glucose/diabetes screening, BMI height to weight ratio, reviewed of smoking status, physical activity, and diet.    Goals include blood pressure 120/80 or less, maintaining a healthy lipid/cholesterol profile, preventing diabetes or keeping diabetes numbers under good control, not smoking or using tobacco products, exercising most days per week or at least 150 minutes per week of exercise, and eating healthy  variety of fruits and vegetables, healthy oils, and avoiding unhealthy food choices like fried food, fast food, high sugar and high cholesterol foods.     I reviewed8/2022 echocardiogram:  IMPRESSIONS Left ventricular ejection fraction, by estimation, is 65 to 70%. The left ventricle has normal function. The left ventricle has no regional wall motion abnormalities. Left ventricular diastolic parameters are indeterminate. 1. 2. Right ventricular systolic function is normal. The right ventricular size is normal. 3. The mitral valve is normal in structure. No evidence of mitral valve regurgitation. The aortic valve is normal in structure. Aortic valve regurgitation is not visualized. No aortic stenosis is present. 4. Aortic dilatation noted. There is mild dilatation of the ascending aorta, measuring 40 mm.   Medical care  options: I recommend you continue to seek care here first for routine care.  We try really hard to have available appointments Monday through Friday daytime hours for sick visits, acute visits, and physicals.  Urgent care should be used for after hours and weekends for significant issues that cannot wait till the next day.  The emergency department should be used for significant potentially life-threatening emergencies.  The emergency department is expensive, can often have long wait times for less significant concerns, so try to utilize primary care, urgent care, or telemedicine when possible to avoid unnecessary trips to the emergency department.  Virtual visits and telemedicine have been introduced since the pandemic started in 2020, and can be convenient ways to receive medical care.  We offer virtual appointments as well to assist you in a variety of options to seek medical care.    Separate significant issues discussed: prostate cancer diagnosed 2022.  Reviewed 05/2022 notes from urology, low risk prostate cancer, they are continuing surveillance q14mo and annual MRI  alternated with biopsy.     ED - on sildenafil per urology, works well for him  Hypertension-continue current medication, advised on monitoring, discussed goal blood pressures  Hyperlipidemia-continue current medication, routine labs today  Microalbuminuria likely due to abnormal blood pressures in the past.  Maintain good blood pressure control  Obesity-discussed need to lose weight through healthy diet and exercise   Maesyn was seen today for annual exam.  Diagnoses and all orders for this visit:  Routine general medical examination at a health care facility -     Comprehensive metabolic panel -     CBC -     Lipid panel -     POCT Urinalysis DIP (Proadvantage Device) -     Hemoglobin A1c -     Microalbumin/Creatinine Ratio, Urine  Screening for diabetes mellitus -     Hemoglobin A1c  Microalbuminuria -     POCT Urinalysis DIP (Proadvantage Device) -     Microalbumin/Creatinine Ratio, Urine  History of anaphylaxis  Essential hypertension  Dyslipidemia -     Lipid panel  History of prostate cancer     Follow-up pending labs, yearly for physical

## 2023-07-15 ENCOUNTER — Other Ambulatory Visit: Payer: Self-pay | Admitting: Medical

## 2023-07-15 DIAGNOSIS — I77819 Aortic ectasia, unspecified site: Secondary | ICD-10-CM

## 2023-07-15 HISTORY — PX: COLONOSCOPY: SHX174

## 2023-07-15 LAB — COMPREHENSIVE METABOLIC PANEL
ALT: 20 [IU]/L (ref 0–44)
AST: 22 [IU]/L (ref 0–40)
Albumin: 4.4 g/dL (ref 3.9–4.9)
Alkaline Phosphatase: 69 [IU]/L (ref 44–121)
BUN/Creatinine Ratio: 15 (ref 10–24)
BUN: 17 mg/dL (ref 8–27)
Bilirubin Total: 0.7 mg/dL (ref 0.0–1.2)
CO2: 22 mmol/L (ref 20–29)
Calcium: 9.6 mg/dL (ref 8.6–10.2)
Chloride: 100 mmol/L (ref 96–106)
Creatinine, Ser: 1.1 mg/dL (ref 0.76–1.27)
Globulin, Total: 2.8 g/dL (ref 1.5–4.5)
Glucose: 111 mg/dL — ABNORMAL HIGH (ref 70–99)
Potassium: 3.4 mmol/L — ABNORMAL LOW (ref 3.5–5.2)
Sodium: 140 mmol/L (ref 134–144)
Total Protein: 7.2 g/dL (ref 6.0–8.5)
eGFR: 74 mL/min/{1.73_m2} (ref >59)

## 2023-07-15 LAB — LIPID PANEL
Chol/HDL Ratio: 2.8 {ratio} (ref 0.0–5.0)
Cholesterol, Total: 99 mg/dL — ABNORMAL LOW (ref 100–199)
HDL: 36 mg/dL — ABNORMAL LOW (ref >39)
LDL Chol Calc (NIH): 44 mg/dL (ref 0–99)
Triglycerides: 100 mg/dL (ref 0–149)
VLDL Cholesterol Cal: 19 mg/dL (ref 5–40)

## 2023-07-15 LAB — CBC
Hematocrit: 41.7 % (ref 37.5–51.0)
Hemoglobin: 13.7 g/dL (ref 13.0–17.7)
MCH: 31.4 pg (ref 26.6–33.0)
MCHC: 32.9 g/dL (ref 31.5–35.7)
MCV: 95 fL (ref 79–97)
Platelets: 202 10*3/uL (ref 150–450)
RBC: 4.37 x10E6/uL (ref 4.14–5.80)
RDW: 11.2 % — ABNORMAL LOW (ref 11.6–15.4)
WBC: 6.7 10*3/uL (ref 3.4–10.8)

## 2023-07-15 LAB — MICROALBUMIN / CREATININE URINE RATIO
Creatinine, Urine: 187.8 mg/dL
Microalb/Creat Ratio: 6 mg/g{creat} (ref 0–29)
Microalbumin, Urine: 11.8 ug/mL

## 2023-07-15 LAB — HEMOGLOBIN A1C
Est. average glucose Bld gHb Est-mCnc: 100 mg/dL
Hgb A1c MFr Bld: 5.1 % (ref 4.8–5.6)

## 2023-07-15 MED ORDER — ROSUVASTATIN CALCIUM 20 MG PO TABS: 20.0000 mg | ORAL_TABLET | Freq: Every day | ORAL | 3 refills | Status: DC

## 2023-07-15 MED ORDER — LISINOPRIL-HYDROCHLOROTHIAZIDE 20-25 MG PO TABS: 1.0000 | ORAL_TABLET | Freq: Every day | ORAL | 3 refills | Status: DC

## 2023-07-15 MED ORDER — MULTI-VITAMIN/MINERALS PO TABS: 1.0000 | ORAL_TABLET | Freq: Every day | ORAL | 3 refills | Status: DC

## 2023-07-15 MED ORDER — METOPROLOL SUCCINATE ER 50 MG PO TB24: ORAL_TABLET | ORAL | 3 refills | Status: DC

## 2023-07-15 NOTE — Progress Notes (Signed)
Left detailed message for pt about having ordered echocardiogram and expect a call from scheduling

## 2023-07-15 NOTE — Progress Notes (Signed)
Results sent through MyChart

## 2023-07-25 ENCOUNTER — Ambulatory Visit (HOSPITAL_COMMUNITY): Payer: Medicare HMO | Attending: Cardiology

## 2023-07-25 DIAGNOSIS — I10 Essential (primary) hypertension: Secondary | ICD-10-CM | POA: Diagnosis not present

## 2023-07-25 DIAGNOSIS — I77819 Aortic ectasia, unspecified site: Secondary | ICD-10-CM | POA: Insufficient documentation

## 2023-07-25 LAB — ECHOCARDIOGRAM COMPLETE
Area-P 1/2: 5.09 cm2
S' Lateral: 2.2 cm

## 2023-07-25 MED ORDER — PERFLUTREN LIPID MICROSPHERE
1.0000 mL | INTRAVENOUS | Status: AC | PRN
Start: 2023-07-25 — End: 2023-07-25
  Administered 2023-07-25: 2 mL via INTRAVENOUS

## 2023-07-27 NOTE — Progress Notes (Signed)
Results sent through MyChart

## 2023-09-17 ENCOUNTER — Other Ambulatory Visit: Payer: Self-pay | Admitting: Medical

## 2023-11-17 ENCOUNTER — Other Ambulatory Visit: Payer: Self-pay | Admitting: Medical

## 2023-11-17 ENCOUNTER — Telehealth: Payer: Self-pay | Admitting: Medical

## 2023-11-17 MED ORDER — FINASTERIDE 5 MG PO TABS: 5.0000 mg | ORAL_TABLET | Freq: Every day | ORAL | 0 refills | Status: DC

## 2023-11-17 NOTE — Telephone Encounter (Signed)
 [facility] to refill

## 2023-11-17 NOTE — Telephone Encounter (Signed)
Pt called he needs refills on finasteride  he is out of meds  Karin Golden

## 2024-02-11 ENCOUNTER — Other Ambulatory Visit: Payer: Self-pay | Admitting: Medical

## 2024-02-28 ENCOUNTER — Other Ambulatory Visit: Payer: Self-pay

## 2024-02-28 ENCOUNTER — Encounter (HOSPITAL_COMMUNITY): Payer: Self-pay | Admitting: Emergency Medicine

## 2024-02-28 ENCOUNTER — Emergency Department (HOSPITAL_COMMUNITY)

## 2024-02-28 ENCOUNTER — Emergency Department (HOSPITAL_COMMUNITY)
Admission: EM | Admit: 2024-02-28 | Discharge: 2024-02-28 | Disposition: A | Discharge status: Home / Self Care | Attending: Emergency Medicine | Admitting: Emergency Medicine

## 2024-02-28 DIAGNOSIS — R109 Unspecified abdominal pain: Secondary | ICD-10-CM | POA: Insufficient documentation

## 2024-02-28 DIAGNOSIS — X500XXA Overexertion from strenuous movement or load, initial encounter: Secondary | ICD-10-CM | POA: Diagnosis not present

## 2024-02-28 DIAGNOSIS — S39012A Strain of muscle, fascia and tendon of lower back, initial encounter: Secondary | ICD-10-CM | POA: Diagnosis not present

## 2024-02-28 DIAGNOSIS — Z8546 Personal history of malignant neoplasm of prostate: Secondary | ICD-10-CM | POA: Insufficient documentation

## 2024-02-28 DIAGNOSIS — S3992XA Unspecified injury of lower back, initial encounter: Secondary | ICD-10-CM | POA: Diagnosis present

## 2024-02-28 LAB — URINALYSIS, ROUTINE W REFLEX MICROSCOPIC
Bilirubin Urine: NEGATIVE
Glucose, UA: NEGATIVE mg/dL
Hgb urine dipstick: NEGATIVE
Ketones, ur: NEGATIVE mg/dL
Leukocytes,Ua: NEGATIVE
Nitrite: NEGATIVE
Protein, ur: NEGATIVE mg/dL
Specific Gravity, Urine: 1.023 (ref 1.005–1.030)
pH: 5 (ref 5.0–8.0)

## 2024-02-28 MED ORDER — KETOROLAC TROMETHAMINE 15 MG/ML IJ SOLN
15.0000 mg | Freq: Once | INTRAMUSCULAR | Status: AC
  Administered 2024-02-28: 15 mg via INTRAMUSCULAR
  Filled 2024-02-28: qty 1

## 2024-02-28 MED ORDER — LIDOCAINE 5 % EX PTCH
1.0000 | MEDICATED_PATCH | CUTANEOUS | Status: DC
  Administered 2024-02-28: 1 via TRANSDERMAL
  Filled 2024-02-28: qty 1

## 2024-02-28 MED ORDER — CYCLOBENZAPRINE HCL 10 MG PO TABS: 10.0000 mg | ORAL_TABLET | Freq: Two times a day (BID) | ORAL | 0 refills | Status: AC | PRN

## 2024-02-28 MED ORDER — CYCLOBENZAPRINE HCL 10 MG PO TABS
10.0000 mg | ORAL_TABLET | Freq: Once | ORAL | Status: AC
  Administered 2024-02-28: 10 mg via ORAL
  Filled 2024-02-28: qty 1

## 2024-02-28 NOTE — ED Triage Notes (Signed)
 Pt to ER with c/o left flank that radiates into left leg.  Pt states he believes pain started after moving cases of water .  Pt states pain increased with certain movements.

## 2024-02-28 NOTE — Discharge Instructions (Addendum)
You were seen for your back pain in the emergency department.   At home, please use over-the-counter Tylenol, ibuprofen, and lidocaine patches.  You may also use the cyclobenzaprine we have prescribed you as needed for pain.  Do not take the cyclobenzaprine before driving or operating heavy machinery.  Follow-up with your primary doctor in 2-3 days regarding your visit.  Follow-up with the spine clinic as soon as possible regarding your symptoms.  Return immediately to the emergency department if you experience any of the following: Numbness or weakness of your legs, bowel or bladder incontinence, numbness while wiping after pooping or urinating, or any other concerning symptoms.    Thank you for visiting our Emergency Department. It was a pleasure taking care of you today.

## 2024-02-28 NOTE — ED Provider Notes (Signed)
 [place] [facility] AT [facility] Provider Note   CSN: [id number] Arrival date & time: 02/28/24  4098     History {Add pertinent medical, surgical, social history, OB history to HPI:1} Chief Complaint  Patient presents with   Flank Pain    Charles Moore is a 68 y.o. male.  68 year old male with history of prostate cancer in remission and herniated disc who presents to the emergency department with left-sided back pain.  Patient reports that over the past 2 weeks has been having left-sided flank pain that started when he was lifting some heavy water  bottles.  Yesterday was lifting more bottles and says that he thinks he hurt it more.  Describes a sharp pain in his left flank that is worsened with movement.  Occasionally radiates down his left leg.  No bowel or bladder incontinence.  No leg weakness or numbness.  No fevers.  No history of IV drug use or long-term indwelling catheters.  Prostate cancer is in remission.  Not on blood thinners.       Home Medications Prior to Admission medications   Medication Sig Start Date End Date Taking? Authorizing Provider  Ascorbic Acid (VITAMIN C) 1000 MG tablet Take 1,000 mg by mouth daily.    [provider]  finasteride  (PROSCAR ) 5 MG tablet TAKE 1 TABLET BY MOUTH DAILY 02/11/24   Tysinger, Christiane Cowing, PA-C  L-Arginine 500 MG CAPS Take 500 mg by mouth daily.    [provider]  lisinopril -hydrochlorothiazide  (ZESTORETIC ) 20-25 MG tablet Take 1 tablet by mouth daily. 07/15/23   Tysinger, Christiane Cowing, PA-C  metoprolol  succinate (TOPROL -XL) 50 MG 24 hr tablet TAKE 1 TABLET BY MOUTH DAILY WITH OR IMMEDIATELY FOLLOWING A MEAL 07/15/23   Tysinger, Christiane Cowing, PA-C  Multiple Vitamins-Minerals (MULTIVITAMIN WITH MINERALS) tablet Take 1 tablet by mouth daily. 07/15/23   Tysinger, Christiane Cowing, PA-C  rosuvastatin  (CRESTOR ) 20 MG tablet Take 1 tablet (20 mg total) by mouth daily. 07/15/23   Tysinger, Christiane Cowing, PA-C      Allergies     Other and Neosporin original [neomycin-bacitracin zn-polymyx]    Review of Systems   Review of Systems  Physical Exam Updated Vital Signs BP 139/73   Pulse 76   Temp 98.2 F (36.8 C)   Resp 18   Ht 5\' 9"  (1.753 m)   Wt 122.5 kg   SpO2 95%   BMI 39.87 kg/m  Physical Exam Abdominal:     Tenderness: There is no right CVA tenderness or left CVA tenderness.  Musculoskeletal:     Comments: No spinal midline TTP in cervical, thoracic, or lumbar spine.  Left flank tenderness to palpation.  No rashes noted there.  No stepoffs noted.   Motor: Muscle bulk and tone are normal. Strength is 5/5 in hip flexion, knee flexion and extension, ankle dorsiflexion and plantar flexion bilaterally. Full strength of great toe dorsiflexion bilaterally.  Sensory: Intact sensation to light touch in L2 though S1 dermatomes bilaterally.     ED Results / Procedures / Treatments   Labs (all labs ordered are listed, but only abnormal results are displayed) Labs Reviewed  URINALYSIS, ROUTINE W REFLEX MICROSCOPIC    EKG None  Radiology No results found.  Procedures Procedures  {Document cardiac monitor, telemetry assessment procedure when appropriate:1}  Medications Ordered in ED Medications  ketorolac  (TORADOL ) 15 MG/ML injection 15 mg (has no administration in time range)  lidocaine  (LIDODERM ) 5 % 1 patch (has no administration in time range)  cyclobenzaprine (FLEXERIL) tablet 10 mg (has no administration in time range)    ED Course/ Medical Decision Making/ A&P   {   Click here for ABCD2, HEART and other calculatorsREFRESH Note before signing :1}                              Medical Decision Making Amount and/or Complexity of Data Reviewed Labs: ordered. Radiology: ordered.  Risk Prescription drug management.   ***  {Document critical care time when appropriate:1} {Document review of labs and clinical decision tools ie heart score, Chads2Vasc2 etc:1}  {Document your  independent review of radiology images, and any outside records:1} {Document your discussion with family members, caretakers, and with consultants:1} {Document social determinants of health affecting pt's care:1} {Document your decision making why or why not admission, treatments were needed:1} Final Clinical Impression(s) / ED Diagnoses Final diagnoses:  None    Rx / DC Orders ED Discharge Orders     None

## 2024-05-10 ENCOUNTER — Other Ambulatory Visit: Payer: Self-pay | Admitting: Medical

## 2024-05-17 ENCOUNTER — Other Ambulatory Visit: Payer: Self-pay | Admitting: Urology

## 2024-05-17 DIAGNOSIS — C61 Malignant neoplasm of prostate: Secondary | ICD-10-CM

## 2024-07-01 ENCOUNTER — Ambulatory Visit
Admission: RE | Admit: 2024-07-01 | Discharge: 2024-07-01 | Disposition: A | Discharge status: Home / Self Care | Source: Ambulatory Visit | Attending: Urology | Admitting: Urology

## 2024-07-01 DIAGNOSIS — C61 Malignant neoplasm of prostate: Secondary | ICD-10-CM

## 2024-07-01 MED ORDER — GADOPICLENOL 0.5 MMOL/ML IV SOLN
10.0000 mL | Freq: Once | INTRAVENOUS | Status: AC | PRN
  Administered 2024-07-01: 10 mL via INTRAVENOUS

## 2024-07-02 ENCOUNTER — Inpatient Hospital Stay: Admission: RE | Admit: 2024-07-02 | Source: Ambulatory Visit

## 2024-07-15 ENCOUNTER — Encounter: Payer: Medicare HMO | Admitting: Medical

## 2024-08-05 ENCOUNTER — Ambulatory Visit

## 2024-08-05 DIAGNOSIS — Z0000 Encounter for general adult medical examination without abnormal findings: Secondary | ICD-10-CM | POA: Diagnosis not present

## 2024-08-05 NOTE — Progress Notes (Signed)
 Subjective:   Charles Moore is a 68 y.o. who presents for a Medicare Wellness preventive visit.  As a reminder, Annual Wellness Visits don't include a physical exam, and some assessments may be limited, especially if this visit is performed virtually. We may recommend an in-person follow-up visit with your provider if needed.  Visit Complete: Virtual I connected with  Charles Moore on 08/05/24 by a audio enabled telemedicine application and verified that I am speaking with the correct person using two identifiers.  Patient Location: Home  Provider Location: Home Office  I discussed the limitations of evaluation and management by telemedicine. The patient expressed understanding and agreed to proceed.  Vital Signs: Because this visit was a virtual/telehealth visit, some criteria may be missing or patient reported. Any vitals not documented were not able to be obtained and vitals that have been documented are patient reported.  VideoError- Librarian, academic were attempted between this provider and patient, however failed, due to patient having technical difficulties OR patient did not have access to video capability.  We continued and completed visit with audio only.   Persons Participating in Visit: Patient.  AWV Questionnaire: No: Patient Medicare AWV questionnaire was not completed prior to this visit.  Cardiac Risk Factors include: advanced age (>25men, >60 women);dyslipidemia;hypertension;male gender     Objective:    Today's Vitals   There is no height or weight on file to calculate BMI.     08/05/2024    4:09 PM 02/28/2024    7:35 AM 11/26/2022    3:30 PM 01/25/2022    7:55 AM 01/23/2022    8:52 AM 01/05/2022    5:23 AM 06/26/2021    8:48 AM  Advanced Directives  Does Patient Have a Medical Advance Directive? No No No No No No No  Would patient like information on creating a medical advance directive?  No - Patient declined  No - Patient  declined No - Patient declined No - Patient declined No - Patient declined    Current Medications (verified) Outpatient Encounter Medications as of 08/05/2024  Medication Sig   Ascorbic Acid (VITAMIN C) 1000 MG tablet Take 1,000 mg by mouth daily.   cyclobenzaprine  (FLEXERIL ) 10 MG tablet Take 1 tablet (10 mg total) by mouth 2 (two) times daily as needed for muscle spasms.   finasteride  (PROSCAR ) 5 MG tablet TAKE 1 TABLET BY MOUTH DAILY   L-Arginine 500 MG CAPS Take 500 mg by mouth daily.   lisinopril -hydrochlorothiazide  (ZESTORETIC ) 20-25 MG tablet Take 1 tablet by mouth daily.   metoprolol  succinate (TOPROL -XL) 50 MG 24 hr tablet TAKE 1 TABLET BY MOUTH DAILY WITH OR IMMEDIATELY FOLLOWING A MEAL   Multiple Vitamins-Minerals (MULTIVITAMIN WITH MINERALS) tablet Take 1 tablet by mouth daily.   rosuvastatin  (CRESTOR ) 20 MG tablet Take 1 tablet (20 mg total) by mouth daily.   No facility-administered encounter medications on file as of 08/05/2024.    Allergies (verified) Other and Neosporin original [neomycin-bacitracin zn-polymyx]   History: Past Medical History:  Diagnosis Date   Allergy    seasonal with rag weed and pollen    Asthma    rare wheezing   Cancer (HCC) 2022   prostate   History of kidney stones    Hyperlipidemia    Hypertension 10/14/2008   Nut allergy    Obesity    Wears glasses    Past Surgical History:  Procedure Laterality Date   CHOLECYSTECTOMY N/A 01/25/2022   Procedure: LAPAROSCOPIC CHOLECYSTECTOMY;  Surgeon: Evonnie Dorothyann LABOR, DO;  Location: [facility];  Service: General;  Laterality: N/A;   COLONOSCOPY  11/2015   polyps, Dr. Elspeth Naval   LITHOTRIPSY     PROSTATE BIOPSY     Family History  Problem Relation Age of Onset   Other Mother        pneumonia   Cancer Father        lung   Cancer Brother        lung   Alcohol abuse Brother    Heart disease Brother 47       CABG   Hypertension Brother    Colon polyps Brother    Colon cancer  Paternal Grandfather    Esophageal cancer Neg Hx    Rectal cancer Neg Hx    Stomach cancer Neg Hx    Social History   Socioeconomic History   Marital status: Married    Spouse name: Not on file   Number of children: 1   Years of education: Not on file   Highest education level: Not on file  Occupational History   Occupation: Retired Merchandiser, retail [employer]  Tobacco Use   Smoking status: Never   Smokeless tobacco: Never  Vaping Use   Vaping status: Never Used  Substance and Sexual Activity   Alcohol use: Not Currently    Comment: 1 beer a month   Drug use: No   Sexual activity: Yes  Other Topics Concern   Not on file  Social History Narrative   Married, 1 child, retired 2018;  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  Exercise - with walking, some weights.   06/2023   Social Drivers of Health   Financial Resource Strain: Low Risk  (08/05/2024)   Overall Financial Resource Strain (CARDIA)    Difficulty of Paying Living Expenses: Not hard at all  Food Insecurity: No Food Insecurity (08/05/2024)   Hunger Vital Sign    Worried About Running Out of Food in the Last Year: Never true    Ran Out of Food in the Last Year: Never true  Transportation Needs: No Transportation Needs (08/05/2024)   PRAPARE - Administrator, Civil Service (Medical): No    Lack of Transportation (Non-Medical): No  Physical Activity: Sufficiently Active (08/05/2024)   Exercise Vital Sign    Days of Exercise per Week: 7 days    Minutes of Exercise per Session: 30 min  Stress: No Stress Concern Present (08/05/2024)   [employer] of Occupational Health - Occupational Stress Questionnaire    Feeling of Stress: Not at all  Social Connections: Moderately Isolated (08/05/2024)   Social Connection and Isolation Panel    Frequency of Communication with Friends and Family: Three times a week    Frequency of Social Gatherings with Friends and Family: Not on file    Attends  Religious Services: Never    Database administrator or Organizations: No    Attends Banker Meetings: Never    Marital Status: Married    Tobacco Counseling Counseling given: Not Answered    Clinical Intake:  Pre-visit preparation completed: Yes  Pain : No/denies pain     Nutritional Risks: None Diabetes: No  Lab Results  Component Value Date   HGBA1C 5.1 07/14/2023   HGBA1C 5.1 07/05/2022   HGBA1C 5.0 07/04/2021     How often do you need to have someone help you when you read instructions, pamphlets, or other written materials from your  doctor or pharmacy?: 1 - Never  Interpreter Needed?: No  Information entered by :: NAllen LPN   Activities of Daily Living     08/05/2024    4:00 PM  In your present state of health, do you have any difficulty performing the following activities:  Hearing? 0  Vision? 0  Difficulty concentrating or making decisions? 0  Walking or climbing stairs? 0  Dressing or bathing? 0  Doing errands, shopping? 0  Preparing Food and eating ? N  Using the Toilet? N  In the past six months, have you accidently leaked urine? N  Do you have problems with loss of bowel control? N  Managing your Medications? N  Managing your Finances? N  Housekeeping or managing your Housekeeping? N    Patient Care Team: Tysinger, Alm RAMAN, PA-C as PCP - General (Family Medicine)  I have updated your Care Teams any recent Medical Services you may have received from other providers in the past year.     Assessment:   This is a routine wellness examination for [employer].  Hearing/Vision screen Hearing Screening - Comments:: Denies hearing issues Vision Screening - Comments:: Regular eye exams, [facility]   Goals Addressed             This Visit's Progress    Patient Stated       08/05/2024, take care of wife       Depression Screen     08/05/2024    4:12 PM 07/14/2023   10:37 AM 11/26/2022    3:32 PM 07/05/2022    8:47 AM 07/04/2021    10:20 AM 03/06/2021    9:36 AM 07/24/2020   12:21 PM  PHQ 2/9 Scores  PHQ - 2 Score 0 0 0 0 0 0 0  PHQ- 9 Score 0  0        Fall Risk     08/05/2024    4:11 PM 07/14/2023   10:37 AM 11/26/2022    3:31 PM 07/05/2022    8:46 AM 01/10/2022   10:30 AM  Fall Risk   Falls in the past year? 1 0 1 0 0  Comment missed a step  fell down the steps    Number falls in past yr: 0 0 0 0   Injury with Fall? 0 0 0 0   Risk for fall due to : Medication side effect No Fall Risks Medication side effect No Fall Risks   Follow up Falls evaluation completed;Falls prevention discussed Falls evaluation completed Falls prevention discussed;Education provided;Falls evaluation completed Falls evaluation completed  Falls evaluation completed      Data saved with a previous flowsheet row definition    MEDICARE RISK AT HOME:  Medicare Risk at Home Any stairs in or around the home?: Yes If so, are there any without handrails?: No Home free of loose throw rugs in walkways, pet beds, electrical cords, etc?: Yes Adequate lighting in your home to reduce risk of falls?: Yes Life alert?: No Use of a cane, walker or w/c?: No Grab bars in the bathroom?: No Shower chair or bench in shower?: Yes Elevated toilet seat or a handicapped toilet?: Yes  TIMED UP AND GO:  Was the test performed?  No  Cognitive Function: 6CIT completed        08/05/2024    4:13 PM 11/26/2022    3:35 PM  6CIT Screen  What Year? 0 points 0 points  What month? 0 points 0 points  What time? 0 points 0  points  Count back from 20 0 points 0 points  Months in reverse 4 points 4 points  Repeat phrase 0 points 4 points  Total Score 4 points 8 points    Immunizations Immunization History  Administered Date(s) Administered   DTaP 04/07/1957, 05/26/1958   Fluad Quad(high Dose 65+) 07/05/2022, 07/25/2024   Hepatitis A 08/08/2019, 01/11/2020   Hepatitis B 08/08/2019, 01/02/2020, 05/10/2020   Influenza,inj,Quad PF,6+ Mos 07/24/2020,  07/04/2021   Influenza-Unspecified 07/03/2015, 07/11/2023   Moderna Sars-Covid-2 Vaccination 03/11/2020   PNEUMOCOCCAL CONJUGATE-20 07/05/2022, 07/11/2023   Pfizer(Comirnaty)Fall Seasonal Vaccine 12 years and older 07/11/2023   RSV,unspecified 07/11/2023   Td 07/04/2021   Tdap 08/15/2011   Unspecified SARS-COV-2 Vaccination 07/25/2024   Zoster Recombinant(Shingrix) 08/08/2019, 01/02/2020    Screening Tests Health Maintenance  Topic Date Due   COVID-19 Vaccine (4 - Mixed Product risk 2025-26 season) 01/23/2025   Medicare Annual Wellness (AWV)  08/05/2025   Colonoscopy  12/12/2029   DTaP/Tdap/Td (5 - Td or Tdap) 07/05/2031   Pneumococcal Vaccine: 50+ Years  Completed   Influenza Vaccine  Completed   Hepatitis C Screening  Completed   Zoster Vaccines- Shingrix  Completed   Meningococcal B Vaccine  Aged Out   Hepatitis B Vaccines 19-59 Average Risk  Discontinued    Health Maintenance Items Addressed: Up to date  Additional Screening:  Vision Screening: Recommended annual ophthalmology exams for early detection of glaucoma and other disorders of the eye. Is the patient up to date with their annual eye exam?  Yes  Who is the provider or what is the name of the office in which the patient attends annual eye exams? [facility]  Dental Screening: Recommended annual dental exams for proper oral hygiene  Community Resource Referral / Chronic Care Management: CRR required this visit?  No   CCM required this visit?  No   Plan:    I have personally reviewed and noted the following in the patient's chart:   Medical and social history Use of alcohol, tobacco or illicit drugs  Current medications and supplements including opioid prescriptions. Patient is not currently taking opioid prescriptions. Functional ability and status Nutritional status Physical activity Advanced directives List of other physicians Hospitalizations, surgeries, and ER visits in previous 12  months Vitals Screenings to include cognitive, depression, and falls Referrals and appointments  In addition, I have reviewed and discussed with patient certain preventive protocols, quality metrics, and best practice recommendations. A written personalized care plan for preventive services as well as general preventive health recommendations were provided to patient.   Ardella FORBES Dawn, LPN   [id number]   After Visit Summary: (Pick Up) Due to this being a telephonic visit, with patients personalized plan was offered to patient and patient has requested to Pick up at office.  Notes: Nothing significant to report at this time.

## 2024-08-05 NOTE — Patient Instructions (Signed)
 Charles Moore,  Thank you for taking the time for your Medicare Wellness Visit. I appreciate your continued commitment to your health goals. Please review the care plan we discussed, and feel free to reach out if I can assist you further.  Medicare recommends these wellness visits once per year to help you and your care team stay ahead of potential health issues. These visits are designed to focus on prevention, allowing your provider to concentrate on managing your acute and chronic conditions during your regular appointments.  Please note that Annual Wellness Visits do not include a physical exam. Some assessments may be limited, especially if the visit was conducted virtually. If needed, we may recommend a separate in-person follow-up with your provider.  Ongoing Care Seeing your primary care provider every 3 to 6 months helps us  monitor your health and provide consistent, personalized care.   Referrals If a referral was made during today's visit and you haven't received any updates within two weeks, please contact the referred provider directly to check on the status.  Recommended Screenings:  Health Maintenance  Topic Date Due   COVID-19 Vaccine (4 - Mixed Product risk 2025-26 season) 01/23/2025   Medicare Annual Wellness Visit  08/05/2025   Colon Cancer Screening  12/12/2029   DTaP/Tdap/Td vaccine (5 - Td or Tdap) 07/05/2031   Pneumococcal Vaccine for age over 23  Completed   Flu Shot  Completed   Hepatitis C Screening  Completed   Zoster (Shingles) Vaccine  Completed   Meningitis B Vaccine  Aged Out   Hepatitis B Vaccine  Discontinued       08/05/2024    4:09 PM  Advanced Directives  Does Patient Have a Medical Advance Directive? No   Advance Care Planning is important because it: Ensures you receive medical care that aligns with your values, goals, and preferences. Provides guidance to your family and loved ones, reducing the emotional burden of decision-making during  critical moments.  Vision: Annual vision screenings are recommended for early detection of glaucoma, cataracts, and diabetic retinopathy. These exams can also reveal signs of chronic conditions such as diabetes and high blood pressure.  Dental: Annual dental screenings help detect early signs of oral cancer, gum disease, and other conditions linked to overall health, including heart disease and diabetes.  Please see the attached documents for additional preventive care recommendations.

## 2024-08-06 ENCOUNTER — Telehealth: Payer: Self-pay | Admitting: Medical

## 2024-08-06 NOTE — Telephone Encounter (Signed)
 Patient is overdue for a visit with Ludie. Please schedule

## 2024-08-06 NOTE — Telephone Encounter (Signed)
 Appt made for CPE next week

## 2024-08-10 ENCOUNTER — Ambulatory Visit (INDEPENDENT_AMBULATORY_CARE_PROVIDER_SITE_OTHER): Admitting: Medical

## 2024-08-10 ENCOUNTER — Encounter: Payer: Self-pay | Admitting: Medical

## 2024-08-10 VITALS — BP 130/74 | HR 60 | Ht 72.0 in | Wt 267.2 lb

## 2024-08-10 DIAGNOSIS — R7301 Impaired fasting glucose: Secondary | ICD-10-CM | POA: Insufficient documentation

## 2024-08-10 DIAGNOSIS — C61 Malignant neoplasm of prostate: Secondary | ICD-10-CM | POA: Diagnosis not present

## 2024-08-10 DIAGNOSIS — Z0000 Encounter for general adult medical examination without abnormal findings: Secondary | ICD-10-CM | POA: Diagnosis not present

## 2024-08-10 DIAGNOSIS — E785 Hyperlipidemia, unspecified: Secondary | ICD-10-CM

## 2024-08-10 DIAGNOSIS — I10 Essential (primary) hypertension: Secondary | ICD-10-CM

## 2024-08-10 DIAGNOSIS — R809 Proteinuria, unspecified: Secondary | ICD-10-CM

## 2024-08-10 NOTE — Progress Notes (Signed)
 Complete physical exam   Patient: Charles Moore   DOB: 07-31-1956   68 y.o. Male  MRN: [id number] Visit Date: 08/10/2024  Chief Complaint  Patient presents with   Annual Exam    Fasting cpe, no concerns    Care Team Dr. Maude Right, ortho Dr. Elspeth Naval, GI Dr. Belvie Clara, urology Dr. Donnice Barge, radiation oncology Dr. Darryle Decent, cardiology September Mormile, Alm RAMAN, PA-C here for primary care   Concerns: AUGUST LONGEST is a 68 year old male who presents for a well visit.  He has no particular worries and has been feeling well. He is retired and engages in physical activities such as using a treadmill and walking.  He is currently taking Crestor  (rosuvastatin ) 20 mg daily for cholesterol, Toprol  XL 50 mg and lisinopril  20/25 mg for blood pressure, finasteride  5 mg daily for prostate issues, and Flexeril  as needed. He also takes vitamins. He does not regularly check his blood pressure at home but will do so if he feels 'kind of funky'.  He had a colonoscopy last year with several polyps found, and a repeat was recommended in seven years. He is under active surveillance for prostate cancer, with PSA checks every six months and alternating annual MRI and alternates with biopsy. He last saw his urologist more recently   He had an echocardiogram in October 2024 showing an ejection fraction of 60-65% and mild left ventricular enlargement. His labs from last year showed slightly low potassium and borderline blood sugars, but cholesterol levels were good.  No other aggravating or relieving factors. No other complaint.   Past Medical History:  Diagnosis Date   Allergy    seasonal with rag weed and pollen    Asthma    rare wheezing   Cancer (HCC) 2022   prostate   History of kidney stones    Hyperlipidemia    Hypertension 10/14/2008   Nut allergy    Obesity    Wears glasses    Past Surgical History:  Procedure Laterality Date   CHOLECYSTECTOMY N/A  01/25/2022   Procedure: LAPAROSCOPIC CHOLECYSTECTOMY;  Surgeon: Evonnie Dorothyann LABOR, DO;  Location: [facility];  Service: General;  Laterality: N/A;   COLONOSCOPY  11/2015   polyps, Dr. Elspeth Naval   COLONOSCOPY  07/2023   multiple polyps, repeat 7 years   LITHOTRIPSY     PROSTATE BIOPSY      Family Status  Relation Name Status   Mother  Deceased   Father  Deceased   Brother percy Deceased   Brother seth jr Alive   Brother OC Alive   Sister patricia Alive   Brother steve Alive   Brother melvin Alive   PGF  (Not Specified)   Neg Hx  (Not Specified)  No partnership data on file   Family History  Problem Relation Age of Onset   Other Mother        pneumonia   Cancer Father        lung   Cancer Brother        lung   Alcohol abuse Brother    Heart disease Brother 24       CABG   Hypertension Brother    Colon polyps Brother    Colon cancer Paternal Grandfather    Esophageal cancer Neg Hx    Rectal cancer Neg Hx    Stomach cancer Neg Hx    Allergies  Allergen Reactions   Other Anaphylaxis    Almonds, cashews, brazil  nuts   Neosporin Original [Neomycin-Bacitracin Zn-Polymyx] Rash    Patient Care Team: Bulah Alm RAMAN, PA-C as PCP - General (Family Medicine)   Medications: Outpatient Medications Prior to Visit  Medication Sig   Ascorbic Acid (VITAMIN C) 1000 MG tablet Take 1,000 mg by mouth daily.   cyclobenzaprine  (FLEXERIL ) 10 MG tablet Take 1 tablet (10 mg total) by mouth 2 (two) times daily as needed for muscle spasms.   finasteride  (PROSCAR ) 5 MG tablet TAKE 1 TABLET BY MOUTH DAILY   L-Arginine 500 MG CAPS Take 500 mg by mouth daily.   lisinopril -hydrochlorothiazide  (ZESTORETIC ) 20-25 MG tablet Take 1 tablet by mouth daily.   metoprolol  succinate (TOPROL -XL) 50 MG 24 hr tablet TAKE 1 TABLET BY MOUTH DAILY WITH OR IMMEDIATELY FOLLOWING A MEAL   Multiple Vitamins-Minerals (MULTIVITAMIN WITH MINERALS) tablet Take 1 tablet by mouth daily.   rosuvastatin   (CRESTOR ) 20 MG tablet Take 1 tablet (20 mg total) by mouth daily.   No facility-administered medications prior to visit.    Review of Systems  Constitutional:  Negative for chills, fever, malaise/fatigue and weight loss.  HENT:  Negative for congestion, ear pain, hearing loss, sore throat and tinnitus.   Eyes:  Negative for blurred vision, pain and redness.  Respiratory:  Negative for cough, hemoptysis and shortness of breath.   Cardiovascular:  Negative for chest pain, palpitations, orthopnea, claudication and leg swelling.  Gastrointestinal:  Negative for abdominal pain, blood in stool, constipation, diarrhea, nausea and vomiting.  Genitourinary:  Negative for dysuria, flank pain, frequency, hematuria and urgency.  Musculoskeletal:  Negative for falls, joint pain and myalgias.  Skin:  Negative for itching and rash.  Neurological:  Negative for dizziness, tingling, speech change, weakness and headaches.  Endo/Heme/Allergies:  Negative for polydipsia. Does not bruise/bleed easily.  Psychiatric/Behavioral:  Negative for depression and memory loss. The patient is not nervous/anxious and does not have insomnia.       Objective    BP 130/74   Pulse 60   Ht 6' (1.829 m)   Wt 267 lb 3.2 oz (121.2 kg)   SpO2 98%   BMI 36.24 kg/m   BP Readings from Last 3 Encounters:  08/10/24 130/74  02/28/24 121/70  07/14/23 120/72   Wt Readings from Last 3 Encounters:  08/10/24 267 lb 3.2 oz (121.2 kg)  02/28/24 270 lb (122.5 kg)  07/14/23 270 lb (122.5 kg)    General appearence: alert, no distress, WD/WN, African American male HEENT: normocephalic, sclerae anicteric, PERRLA, EOMi Neck: supple, no lymphadenopathy, no thyromegaly, no masses, no bruits Heart: RRR, normal S1, S2, no murmurs Lungs: CTA bilaterally, no wheezes, rhonchi, or rales Abdomen: +bs, soft, non tender, non distended, no masses, no hepatomegaly, no splenomegaly Back: non tender Musculoskeletal: nontender, no swelling,  no obvious deformity Extremities: no edema, no cyanosis, no clubbing Pulses: 2+ symmetric, upper and lower extremities, normal cap refill Neurological: alert, oriented x 3, CN2-12 intact, strength normal upper extremities and lower extremities, sensation normal throughout, DTRs 2+ throughout, no cerebellar signs, gait normal Psychiatric: normal affect, behavior normal, pleasant  GU/rectal - deferred to urology   Last depression screening scores    08/05/2024    4:12 PM 07/14/2023   10:37 AM 11/26/2022    3:32 PM  PHQ 2/9 Scores  PHQ - 2 Score 0 0 0  PHQ- 9 Score 0  0   Last fall risk screening    08/05/2024    4:11 PM  Fall Risk   Falls in the  past year? 1  Comment missed a step  Number falls in past yr: 0  Injury with Fall? 0  Risk for fall due to : Medication side effect  Follow up Falls evaluation completed;Falls prevention discussed       Assessment & Plan   Encounter Diagnoses  Name Primary?   Routine general medical examination at a health care facility Yes   Microalbuminuria    Malignant neoplasm of prostate [facility])    Essential hypertension    Dyslipidemia    Impaired fasting blood sugar     This visit was a preventative care visit, also known as wellness visit or routine physical.   Topics typically include healthy lifestyle, diet, exercise, preventative care, vaccinations, sick and well care, proper use of emergency dept and after hours care, as well as other concerns.     Recommendations: Continue to return yearly for your annual wellness and preventative care visits.  This gives us  a chance to discuss healthy lifestyle, exercise, vaccinations, review your chart record, and perform screenings where appropriate.  I recommend you see your eye doctor yearly for routine vision care.  I recommend you see your dentist yearly for routine dental care including hygiene visits twice yearly.   Vaccination recommendations were reviewed Immunization History   Administered Date(s) Administered   DTaP 04/07/1958, 05/26/1958   Fluad Quad(high Dose 65+) 07/05/2022, 07/25/2024   Hepatitis A 08/08/2019, 01/11/2020   Hepatitis B 08/08/2019, 01/02/2020, 05/10/2020   Influenza,inj,Quad PF,6+ Mos 07/24/2020, 07/04/2021   Influenza-Unspecified 07/03/2015, 07/11/2023   Moderna Sars-Covid-2 Vaccination 03/11/2020   PNEUMOCOCCAL CONJUGATE-20 07/05/2022, 07/11/2023   Pfizer(Comirnaty)Fall Seasonal Vaccine 12 years and older 07/11/2023   RSV,unspecified 07/11/2023   Td 07/04/2021   Tdap 08/15/2011   Unspecified SARS-COV-2 Vaccination 07/25/2024   Zoster Recombinant(Shingrix) 08/08/2019, 01/02/2020    Screening for cancer: Colon cancer screening: Reviewed 12/2022 colonoscopy.  Repeat in 7 years.  Skin cancer screening: Check your skin regularly for new changes, growing lesions, or other lesions of concern Come in for evaluation if you have skin lesions of concern.  Lung cancer screening: If you have a greater than 20 pack year history of tobacco use, then you may qualify for lung cancer screening with a chest CT scan.   Please call your insurance company to inquire about coverage for this test.  We currently don't have screenings for other cancers besides breast, cervical, colon, and lung cancers.  If you have a strong family history of cancer or have other cancer screening concerns, please let me know.    Bone health: Get at least 150 minutes of aerobic exercise weekly Get weight bearing exercise at least once weekly Bone density test:  A bone density test is an imaging test that uses a type of X-ray to measure the amount of calcium  and other minerals in your bones. The test may be used to diagnose or screen you for a condition that causes weak or thin bones (osteoporosis), predict your risk for a broken bone (fracture), or determine how well your osteoporosis treatment is working. The bone density test is recommended for females 65 and older, or  females or males <65 if certain risk factors such as thyroid  disease, long term use of steroids such as for asthma or rheumatological issues, vitamin D deficiency, estrogen deficiency, family history of osteoporosis, self or family history of fragility fracture in first degree relative.    Heart health: Get at least 150 minutes of aerobic exercise weekly Limit alcohol It is important to maintain  a healthy blood pressure and healthy cholesterol numbers  Heart disease screening: Screening for heart disease includes screening for blood pressure, fasting lipids, glucose/diabetes screening, BMI height to weight ratio, reviewed of smoking status, physical activity, and diet.    Goals include blood pressure 120/80 or less, maintaining a healthy lipid/cholesterol profile, preventing diabetes or keeping diabetes numbers under good control, not smoking or using tobacco products, exercising most days per week or at least 150 minutes per week of exercise, and eating healthy variety of fruits and vegetables, healthy oils, and avoiding unhealthy food choices like fried food, fast food, high sugar and high cholesterol foods.    I reviewed/2022 echocardiogram:  IMPRESSIONS Left ventricular ejection fraction, by estimation, is 65 to 70%. The left ventricle has normal function. The left ventricle has no regional wall motion abnormalities. Left ventricular diastolic parameters are indeterminate. 1. 2. Right ventricular systolic function is normal. The right ventricular size is normal. 3. The mitral valve is normal in structure. No evidence of mitral valve regurgitation. The aortic valve is normal in structure. Aortic valve regurgitation is not visualized. No aortic stenosis is present. 4. Aortic dilatation noted. There is mild dilatation of the ascending aorta, measuring 40 mm.   Medical care options: I recommend you continue to seek care here first for routine care.  We try really hard to have available  appointments Monday through Friday daytime hours for sick visits, acute visits, and physicals.  Urgent care should be used for after hours and weekends for significant issues that cannot wait till the next day.  The emergency department should be used for significant potentially life-threatening emergencies.  The emergency department is expensive, can often have long wait times for less significant concerns, so try to utilize primary care, urgent care, or telemedicine when possible to avoid unnecessary trips to the emergency department.  Virtual visits and telemedicine have been introduced since the pandemic started in 2020, and can be convenient ways to receive medical care.  We offer virtual appointments as well to assist you in a variety of options to seek medical care.    Separate significant issues discussed: Prostate cancer diagnosed 2022.  Reviewed 05/2024 notes from urology.  Is on active surveillance, follow up q6 months, repeat MRI yearly alternating with biopsy.    ED - on sildenafil per urology, works well for him  Hypertension-continue current medication, Lisinopril  HCT 20/25mg  daily, Toprol  Xl 50mg  daily.    Advised on monitoring, discussed goal blood pressures  Hyperlipidemia-continue current medication, Crestor  20mg  daily.  Routine labs today  Microalbuminuria likely due to abnormal blood pressures in the past.  Maintain good blood pressure control  Obesity-discussed need to lose weight through healthy diet and exercise   Donaldson was seen today for annual exam.  Diagnoses and all orders for this visit:  Routine general medical examination at a health care facility -     CBC -     Comprehensive metabolic panel with GFR -     Lipid panel -     Hemoglobin A1c -     Microalbumin/Creatinine Ratio, Urine  Microalbuminuria -     Microalbumin/Creatinine Ratio, Urine  Malignant neoplasm of prostate (HCC)  Essential hypertension  Dyslipidemia -     Lipid panel  Impaired  fasting blood sugar -     Hemoglobin A1c    Follow-up pending labs, yearly for physical

## 2024-08-11 ENCOUNTER — Other Ambulatory Visit: Payer: Self-pay | Admitting: Medical

## 2024-08-11 ENCOUNTER — Ambulatory Visit: Payer: Self-pay | Admitting: Medical

## 2024-08-11 LAB — CBC
Hematocrit: 41.2 % (ref 37.5–51.0)
Hemoglobin: 13.5 g/dL (ref 13.0–17.7)
MCH: 31.5 pg (ref 26.6–33.0)
MCHC: 32.8 g/dL (ref 31.5–35.7)
MCV: 96 fL (ref 79–97)
Platelets: 207 x10E3/uL (ref 150–450)
RBC: 4.29 x10E6/uL (ref 4.14–5.80)
RDW: 11.6 % (ref 11.6–15.4)
WBC: 7.8 x10E3/uL (ref 3.4–10.8)

## 2024-08-11 LAB — COMPREHENSIVE METABOLIC PANEL WITH GFR
ALT: 20 IU/L (ref 0–44)
AST: 25 IU/L (ref 0–40)
Albumin: 4.5 g/dL (ref 3.9–4.9)
Alkaline Phosphatase: 59 IU/L (ref 47–123)
BUN/Creatinine Ratio: 11 (ref 10–24)
BUN: 11 mg/dL (ref 8–27)
Bilirubin Total: 0.8 mg/dL (ref 0.0–1.2)
CO2: 23 mmol/L (ref 20–29)
Calcium: 9.7 mg/dL (ref 8.6–10.2)
Chloride: 101 mmol/L (ref 96–106)
Creatinine, Ser: 1 mg/dL (ref 0.76–1.27)
Globulin, Total: 3 g/dL (ref 1.5–4.5)
Glucose: 95 mg/dL (ref 70–99)
Potassium: 3.4 mmol/L — ABNORMAL LOW (ref 3.5–5.2)
Sodium: 137 mmol/L (ref 134–144)
Total Protein: 7.5 g/dL (ref 6.0–8.5)
eGFR: 82 mL/min/1.73 (ref >59)

## 2024-08-11 LAB — LIPID PANEL
Chol/HDL Ratio: 3 ratio (ref 0.0–5.0)
Cholesterol, Total: 121 mg/dL (ref 100–199)
HDL: 40 mg/dL (ref >39)
LDL Chol Calc (NIH): 63 mg/dL (ref 0–99)
Triglycerides: 95 mg/dL (ref 0–149)
VLDL Cholesterol Cal: 18 mg/dL (ref 5–40)

## 2024-08-11 LAB — MICROALBUMIN / CREATININE URINE RATIO
Creatinine, Urine: 172.7 mg/dL
Microalb/Creat Ratio: 5 mg/g{creat} (ref 0–29)
Microalbumin, Urine: 9 ug/mL

## 2024-08-11 LAB — HEMOGLOBIN A1C
Est. average glucose Bld gHb Est-mCnc: 94 mg/dL
Hgb A1c MFr Bld: 4.9 % (ref 4.8–5.6)

## 2024-08-11 MED ORDER — METOPROLOL SUCCINATE ER 50 MG PO TB24: ORAL_TABLET | ORAL | 3 refills | Status: AC

## 2024-08-11 MED ORDER — LISINOPRIL-HYDROCHLOROTHIAZIDE 20-12.5 MG PO TABS: 1.0000 | ORAL_TABLET | Freq: Every day | ORAL | 1 refills | Status: DC

## 2024-08-11 MED ORDER — FINASTERIDE 5 MG PO TABS: 5.0000 mg | ORAL_TABLET | Freq: Every day | ORAL | 3 refills | Status: AC

## 2024-08-11 MED ORDER — ROSUVASTATIN CALCIUM 20 MG PO TABS: 20.0000 mg | ORAL_TABLET | Freq: Every day | ORAL | 3 refills | Status: AC

## 2024-08-11 MED ORDER — MULTI-VITAMIN/MINERALS PO TABS: 1.0000 | ORAL_TABLET | Freq: Every day | ORAL | 3 refills | Status: AC

## 2024-08-11 NOTE — Progress Notes (Signed)
Results to mychart.

## 2024-08-11 NOTE — Progress Notes (Signed)
 Results through MyChart, see other message

## 2024-08-16 NOTE — Telephone Encounter (Signed)
 Pt was notified that medication was sent in  Copied from CRM #[id number]. Topic: Clinical - Prescription Issue >> Aug 16, 2024  1:28 PM Brittany M wrote: Reason for CRM: Patient was seen last week- has not heard anything about medications yet, lease reach out to patient

## 2024-09-03 ENCOUNTER — Other Ambulatory Visit: Payer: Self-pay | Admitting: Medical

## 2024-09-03 NOTE — Telephone Encounter (Signed)
   Already refilled for a year

## 2024-09-20 ENCOUNTER — Other Ambulatory Visit: Payer: Self-pay | Admitting: Medical

## 2024-10-12 ENCOUNTER — Other Ambulatory Visit: Payer: Self-pay | Admitting: Medical

## 2024-10-12 MED ORDER — LISINOPRIL-HYDROCHLOROTHIAZIDE 20-12.5 MG PO TABS: 1.0000 | ORAL_TABLET | Freq: Every day | ORAL | 1 refills | Status: AC

## 2024-10-12 NOTE — Telephone Encounter (Signed)
Dose was lowered

## 2024-10-12 NOTE — Telephone Encounter (Signed)
 Copied from CRM [id number]. Topic: Clinical - Medication Refill >> Oct 12, 2024  9:37 AM Tanazia G wrote: Medication:  lisinopril -hydrochlorothiazide  (ZESTORETIC ) 20-12.5 MG tablet     Has the patient contacted their pharmacy? Yes (Agent: If no, request that the patient contact the pharmacy for the refill. If patient does not wish to contact the pharmacy document the reason why and proceed with request.) (Agent: If yes, when and what did the pharmacy advise?)  This is the patient's preferred pharmacy:  [facility] [id number] - [place], [place] - [address] [place] [address] Phone: [phone] Fax: [phone]  Is this the correct pharmacy for this prescription? Yes If no, delete pharmacy and type the correct one.   Has the prescription been filled recently? Yes  Is the patient out of the medication? Yes  Has the patient been seen for an appointment in the last year OR does the patient have an upcoming appointment? Yes  Can we respond through MyChart? Yes  Agent: Please be advised that Rx refills may take up to 3 business days. We ask that you follow-up with your pharmacy.

## 2024-11-01 ENCOUNTER — Encounter: Payer: Self-pay | Admitting: Family Medicine

## 2024-11-01 ENCOUNTER — Ambulatory Visit: Admitting: Family Medicine

## 2024-11-01 ENCOUNTER — Ambulatory Visit: Payer: Self-pay

## 2024-11-01 VITALS — BP 124/74 | HR 76 | Temp 101.6°F | Ht 72.0 in | Wt 263.0 lb

## 2024-11-01 DIAGNOSIS — J101 Influenza due to other identified influenza virus with other respiratory manifestations: Secondary | ICD-10-CM

## 2024-11-01 DIAGNOSIS — R051 Acute cough: Secondary | ICD-10-CM | POA: Diagnosis not present

## 2024-11-01 DIAGNOSIS — R509 Fever, unspecified: Secondary | ICD-10-CM

## 2024-11-01 LAB — POC COVID19 BINAXNOW: SARS Coronavirus 2 Ag: NEGATIVE

## 2024-11-01 LAB — POCT INFLUENZA A/B
Influenza A, POC: POSITIVE — AB
Influenza B, POC: NEGATIVE

## 2024-11-01 MED ORDER — OSELTAMIVIR PHOSPHATE 75 MG PO CAPS: 75.0000 mg | ORAL_CAPSULE | Freq: Two times a day (BID) | ORAL | 0 refills | Status: AC

## 2024-11-01 NOTE — Progress Notes (Signed)
 Chief Complaint  Patient presents with   Cough    Chest congestion that started around Christmas. Cough that began 2 days ago. Body aches that started 2 days ago. Felt hot last night.     Late Saturday night/early Sunday morning (10/31/24) he started with body aches, congestion, fever (tactile), chills. He felt better after a shower today, but that improvement was short-lived. Feeling fatigued, achey.  He started taking Coricidin today, which has helped a whole lot. He had been taking Mucinex DM 12 hour regularly until today.   PMH, PSH, SH reviewed HLD, HTN  Outpatient Encounter Medications as of 11/01/2024  Medication Sig Note   Ascorbic Acid (VITAMIN C) 1000 MG tablet Take 1,000 mg by mouth daily.    cyclobenzaprine  (FLEXERIL ) 10 MG tablet Take 1 tablet (10 mg total) by mouth 2 (two) times daily as needed for muscle spasms. 11/01/2024: As needed   DM-APAP-CPM (CORICIDIN HBP PO) Take 2 tablets by mouth as needed. 11/01/2024: Last dose noon   finasteride  (PROSCAR ) 5 MG tablet Take 1 tablet (5 mg total) by mouth daily.    L-Arginine 500 MG CAPS Take 500 mg by mouth daily.    lisinopril -hydrochlorothiazide  (ZESTORETIC ) 20-12.5 MG tablet Take 1 tablet by mouth daily.    metoprolol  succinate (TOPROL -XL) 50 MG 24 hr tablet TAKE 1 TABLET BY MOUTH DAILY WITH OR IMMEDIATELY FOLLOWING A MEAL    Multiple Vitamins-Minerals (MULTIVITAMIN WITH MINERALS) tablet Take 1 tablet by mouth daily.    rosuvastatin  (CRESTOR ) 20 MG tablet Take 1 tablet (20 mg total) by mouth daily.    No facility-administered encounter medications on file as of 11/01/2024.   Allergies  Allergen Reactions   Other Anaphylaxis    Almonds, cashews, brazil nuts   Neosporin Original [Neomycin-Bacitracin Zn-Polymyx] Rash     ROS: +f/c, URI symptoms per HPI. No n/v/d, urinary complaints. No rashes, bleeding, bruising. No palpitations.  +chest congestion, no pain. No shortness of breath (very  slight). +fatigue.   PHYSICAL EXAM:  BP 124/74   Pulse 76   Temp (!) 101.6 F (38.7 C) (Tympanic)   Ht 6' (1.829 m)   Wt 263 lb (119.3 kg)   BMI 35.67 kg/m   Pleasant, well-appearing male in no distress HEENT: conjunctiva and sclera are clear, EOMI.  R TM not visualized due to cerumen L TM partly visualized (nonobstructed cerumen) and is normal. Nasal mucosa is moderately inflamed on the left, mild on the right. No purulence. Sinuses nontender. OP is clear Neck: no lymphadenopathy or mass Heart: regular rate and rhythm Lungs: clear bilaterally Neuro: alert and oriented, cranial nerves grossly intact. Normal gait Psych: normal mood, affect, hygiene and grooming    Influenza A positive Negative COVID and flu B   ASSESSMENT/PLAN:  Influenza A - treat with Tamiflu .  Supportive measures reviewed. Discussed precautions for staying home, keeping household members healthy - Plan: oseltamivir  (TAMIFLU ) 75 MG capsule  Acute cough - Plan: POC COVID-19 BinaxNow, Influenza A/B  Fever, unspecified fever cause - Plan: POC COVID-19 BinaxNow, Influenza A/B  Stay well hydrated. Continue the coricidin, since that seems to be helping. If you have any very thick mucus or phlegm, you can add guaifenesin (this is PLAIN mucinex or plain robitussin--nothing that has DM or a decongestant in it. You should avoid those due to the coricidin containing the same DM ingredient as a cough suppressant, and decongestants will raise your blood pressure sinus medications).  The coricidin contains acetaminophen  (tylenol ). Do not take separate tylenol  if you  are using the coricidin regularly. If you have a fever despite having taken the Coricidin, you can use ibuprofen (motrin or advil) to help with body aches and fever.  Be sure to take it with food.  Take the Tamiflu  twice daily for 5 days (anti-viral to treat influenza).

## 2024-11-01 NOTE — Telephone Encounter (Signed)
 FYI Only or Action Required?: FYI only for provider: appointment scheduled on 11/01/24.  Patient was last seen in primary care on 08/10/2024 by Bulah Alm RAMAN, PA-C.  Called Nurse Triage reporting Cough.  Symptoms began several days ago.  Interventions attempted: OTC medications: Mucinex.  Symptoms are: stable.  Triage Disposition: See Physician Within 24 Hours  Patient/caregiver understands and will follow disposition?: Yes  Reason for Disposition  [1] Continuous (nonstop) coughing interferes with work or school AND [2] no improvement using cough treatment per Care Advice  Answer Assessment - Initial Assessment Questions Pt reports having asthma, does not use inhaler or nebulizer solutions. Reports bad wheezing last night, but not currently. Pt taken Mucinex.   1. ONSET: When did the cough begin?      2 days ago  2. SPUTUM: Describe the color of your sputum (e.g., none, dry cough; clear, white, yellow, green)     Clear  3. HEMOPTYSIS: Are you coughing up any blood? If Yes, ask: How much? (e.g., flecks, streaks, tablespoons, etc.)     Denies  4. DIFFICULTY BREATHING: Are you having difficulty breathing? If Yes, ask: How bad is it? (e.g., mild, moderate, severe)      Denies currently, said maybe last night, up all night coughing  5. FEVER: Do you have a fever? If Yes, ask: What is your temperature, how was it measured, and when did it start?     Unable to assess, but reports feeling warm last night, not currently  6. CARDIAC HISTORY: Do you have any history of heart disease? (e.g., heart attack, congestive heart failure)      Hypertension  7. LUNG HISTORY: Do you have any history of lung disease?  (e.g., pulmonary embolus, asthma, emphysema)     Asthma  8. OTHER SYMPTOMS: Do you have any other symptoms? (e.g., runny nose, wheezing, chest pain)       Dizziness last night, has improved. Chest was burning with coughing, but mostly congested, body  aches, fatigued. Urinary frequency last night, denies today, denies dysuria, hematuria, flank pain  Protocols used: Cough - Acute Productive-A-AH     Message from Tiffany B sent at 11/01/2024  9:38 AM EST  Summary: Dizziness / Wheezing   Reason for Triage: Patient experiencing coughing, dizziness has improved, feeling weak,body aches for the last couple days with frequent urination due to drinking a lot of water .Chest is burning when coughing and wheezing.

## 2024-11-01 NOTE — Patient Instructions (Addendum)
 Stay well hydrated. Continue the coricidin, since that seems to be helping. If you have any very thick mucus or phlegm, you can add guaifenesin (this is PLAIN mucinex or plain robitussin--nothing that has DM or a decongestant in it. You should avoid those due to the coricidin containing the same DM ingredient as a cough suppressant, and decongestants will raise your blood pressure sinus medications).  The coricidin contains acetaminophen  (tylenol ). Do not take separate tylenol  if you are using the coricidin regularly. If you have a fever despite having taken the Coricidin, you can use ibuprofen (motrin or advil) to help with body aches and fever.  Be sure to take it with food.  Take the Tamiflu  twice daily for 5 days (anti-viral to treat influenza).  Start this today (needs to be started within 48 hours of symptoms).

## 2025-08-16 ENCOUNTER — Ambulatory Visit: Payer: Self-pay
